# Patient Record
Sex: Male | Born: 1955
Health system: Southern US, Community
[De-identification: ages and names within clinical notes are randomized; demographics above are authoritative.]

## PROBLEM LIST (undated history)

## (undated) DIAGNOSIS — I2510 Atherosclerotic heart disease of native coronary artery without angina pectoris: Secondary | ICD-10-CM

## (undated) DIAGNOSIS — E785 Hyperlipidemia, unspecified: Secondary | ICD-10-CM

## (undated) DIAGNOSIS — G4730 Sleep apnea, unspecified: Secondary | ICD-10-CM

## (undated) DIAGNOSIS — K635 Polyp of colon: Secondary | ICD-10-CM

## (undated) DIAGNOSIS — R9430 Abnormal result of cardiovascular function study, unspecified: Secondary | ICD-10-CM

## (undated) DIAGNOSIS — E039 Hypothyroidism, unspecified: Secondary | ICD-10-CM

## (undated) DIAGNOSIS — N400 Enlarged prostate without lower urinary tract symptoms: Secondary | ICD-10-CM

## (undated) HISTORY — DX: Sleep apnea, unspecified: G47.30

## (undated) HISTORY — DX: Atherosclerotic heart disease of native coronary artery without angina pectoris: I25.10

## (undated) HISTORY — PX: NASAL SEPTUM SURGERY: SHX37

## (undated) HISTORY — DX: Polyp of colon: K63.5

## (undated) HISTORY — DX: Hypothyroidism, unspecified: E03.9

## (undated) HISTORY — DX: Abnormal result of cardiovascular function study, unspecified: R94.30

## (undated) HISTORY — DX: Hyperlipidemia, unspecified: E78.5

## (undated) HISTORY — DX: Benign prostatic hyperplasia without lower urinary tract symptoms: N40.0

---
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
## 2001-09-10 HISTORY — PX: CARDIAC CATHETERIZATION: SHX172

## 2001-09-18 ENCOUNTER — Inpatient Hospital Stay (HOSPITAL_COMMUNITY): Admission: EM | Admit: 2001-09-18 | Discharge: 2001-09-19 | Payer: Self-pay

## 2001-09-19 ENCOUNTER — Encounter: Payer: Self-pay | Admitting: *Deleted

## 2005-09-18 ENCOUNTER — Ambulatory Visit: Payer: Self-pay | Admitting: Cardiology

## 2005-12-25 ENCOUNTER — Encounter: Payer: Self-pay | Admitting: Cardiology

## 2007-06-09 ENCOUNTER — Encounter: Payer: Self-pay | Admitting: Cardiology

## 2007-06-13 ENCOUNTER — Encounter: Payer: Self-pay | Admitting: Physician Assistant

## 2007-06-13 ENCOUNTER — Ambulatory Visit: Payer: Self-pay | Admitting: Cardiology

## 2008-01-09 LAB — HM COLONOSCOPY

## 2010-03-27 ENCOUNTER — Encounter: Payer: Self-pay | Admitting: Cardiology

## 2010-03-27 DIAGNOSIS — E119 Type 2 diabetes mellitus without complications: Secondary | ICD-10-CM | POA: Insufficient documentation

## 2010-03-27 DIAGNOSIS — E785 Hyperlipidemia, unspecified: Secondary | ICD-10-CM | POA: Insufficient documentation

## 2010-03-27 HISTORY — DX: Hyperlipidemia, unspecified: E78.5

## 2010-03-28 ENCOUNTER — Ambulatory Visit: Payer: Self-pay | Admitting: Cardiology

## 2010-05-11 LAB — HM DIABETES EYE EXAM

## 2010-10-12 NOTE — Assessment & Plan Note (Signed)
Summary: EST- HAS NOT BEEN SEEN SINCE 2008   Visit Type:  Follow-up Primary Provider:  Dr. Rudi Heap  CC:  CAD.  History of Present Illness: The patient is seen in followup coronary artery disease. I saw him last in October, 2008. We know from catheterization in 2003 that the patient had nonobstructive coronary disease. He is not having any symptoms. 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 He followed carefully with Dr.Moore  concerning all the medications for his problems.  Preventive Screening-Counseling & Management  Alcohol-Tobacco     Smoking Status: quit     Year Quit: 15 yr ago  Current Medications (verified): 1)  Humulin N 100 Unit/ml Susp (Insulin Isophane Human) .... 50 Units Every Morning and 40 Units Every Evening 2)  Humulin R 100 Unit/ml Soln (Insulin Regular Human) .Marland Kitchen.. 10 Uints Every Morning and 10 Units Every Evening 3)  Lovaza 1 Gm Caps (Omega-3-Acid Ethyl Esters) .... Take 1 Tablet By Mouth Once A Day 4)  Aspir-Low 81 Mg Tbec (Aspirin) .... Take 1 Tablet By Mouth Once A Day 5)  Levothroid 175 Mcg Tabs (Levothyroxine Sodium) .... Take 1 Tablet By Mouth Once A Day 6)  Vitamin D 1000 Unit Tabs (Cholecalciferol) .... 2 Tabs Every Morning 7)  Ramipril 5 Mg Caps (Ramipril) .... Take 1 Tablet By Mouth Once A Day 8)  Lipitor 80 Mg Tabs (Atorvastatin Calcium) .... Take 1/2 Tab (40mg ) At Bedtime 9)  Niaspan 500 Mg Cr-Tabs (Niacin (Antihyperlipidemic)) .... 3 Tabs At Bedtime  Allergies: 1)  ! * Shrimp  Past History:  Past Medical History: CAD   nonobstructive...cath...2003 Ejection fraction 55%... catheterization... 2003 Hypertension Dyslipidemia DM Hypothyroidism Shrimp allergy  Social History: Smoking Status:  quit  Review of Systems       Patient denies fever, chills, headache, sweats, rash, change in vision, change in hearing, chest pain, cough, nausea vomiting, urinary symptoms. All other systems are reviewed and are  negative.  Vital Signs:  Patient profile:   55 year old male Height:      71 inches Weight:      233 pounds BMI:     32.61 Pulse rate:   64 / minute BP sitting:   122 / 75  (left arm) Cuff size:   regular  Vitals Entered By: Hoover Brunette, LPN (March 28, 2010 10:33 AM)  Nutrition Counseling: Patient's BMI is greater than 25 and therefore counseled on weight management options. CC: CAD Is Patient Diabetic? Yes Comments routine check - no problems   Physical Exam  General:  patient is stable. Head:  head is atraumatic. Eyes:  no xanthelasma. Neck:  no jugular venous distention. Chest Wall:  no chest wall tenderness. Lungs:  lungs are clear. Respiratory effort is nonlabored. Heart:  cardiac exam reveals an S1-S2. No clicks or significant murmurs. Abdomen:  abdomen is soft. Msk:  no musculoskeletal deformities. Extremities:  no peripheral edema. Skin:  no skin rashes. Psych:  patient is oriented to person time and place. Affect is normal.   Impression & Recommendations:  Problem # 1:  DYSLIPIDEMIA (ICD-272.4)  His updated medication list for this problem includes:    Lovaza 1 Gm Caps (Omega-3-acid ethyl esters) .Marland Kitchen... Take 1 tablet by mouth once a day    Lipitor 80 Mg Tabs (Atorvastatin calcium) .Marland Kitchen... Take 1/2 tab (40mg ) at bedtime    Niaspan 500 Mg Cr-tabs (Niacin (antihyperlipidemic)) .Marland KitchenMarland KitchenMarland KitchenMarland Kitchen 3 tabs at bedtime Patient's lipids are treated aggressively. No change in therapy.  Problem # 2:  HYPERTENSION (ICD-401.9) Blood pressure is under good control. No change in therapy.  Problem # 3:  CAD (ICD-414.00)  His updated medication list for this problem includes:    Aspir-low 81 Mg Tbec (Aspirin) .Marland Kitchen... Take 1 tablet by mouth once a day    Ramipril 5 Mg Caps (Ramipril) .Marland Kitchen... Take 1 tablet by mouth once a day  Orders: EKG w/ Interpretation (93000) EKG is done today and reviewed by me. There no significant abnormalities. I reviewed the old records concerning the patient's  catheterization in the past. At this time I cannot find the specifics but he had only mild nonobstructive coronary disease in 2003. He is receiving excellent secondary prevention. He is very active and has no symptoms. I consider exercise testing but decided is not needed at this time. I'll see him back for followup in 2 years.  Patient Instructions: 1)  Your physician wants you to follow-up in: 2 years. You will receive a reminder letter in the mail one-two months in advance. If you don't receive a letter, please call our office to schedule the follow-up appointment.  2)  Your physician recommends that you continue on your current medications as directed. Please refer to the Current Medication list given to you today.

## 2010-10-12 NOTE — Miscellaneous (Signed)
  Clinical Lists Changes  Problems: Added new problem of CAD (ICD-414.00) Added new problem of HYPERTENSION (ICD-401.9) Added new problem of DYSLIPIDEMIA (ICD-272.4) Added new problem of DM (ICD-250.00) Added new problem of HYPOTHYROIDISM (ICD-244.9) Observations: Added new observation of PAST MED HX: CAD   nonobstructive...cath...2003 Hypertension Dyslipidemia DM Hypothyroidism Shrimp allergy (03/27/2010 9:34)       Past History:  Past Medical History: CAD   nonobstructive...cath...2003 Hypertension Dyslipidemia DM Hypothyroidism Shrimp allergy

## 2010-10-17 LAB — HEMOCCULT GUIAC POC 1CARD (OFFICE)

## 2010-10-24 LAB — HM DIABETES FOOT EXAM

## 2010-10-24 LAB — HEMOGLOBIN A1C: Hgb A1c MFr Bld: 7.7 % — AB (ref 4.0–6.0)

## 2011-01-03 ENCOUNTER — Encounter: Payer: Self-pay | Admitting: Family Medicine

## 2011-01-03 DIAGNOSIS — K635 Polyp of colon: Secondary | ICD-10-CM

## 2011-01-03 DIAGNOSIS — N400 Enlarged prostate without lower urinary tract symptoms: Secondary | ICD-10-CM | POA: Insufficient documentation

## 2011-01-23 NOTE — Assessment & Plan Note (Signed)
[facility]                          [facility] OFFICE NOTE   NAITIK, HERMANN                        MRN:          [id number]  DATE:06/13/2007                            DOB:          [birth year]    CARDIOLOGIST:  Luis Abed, MD, [facility].   PRIMARY CARE PHYSICIAN:  Ernestina Penna, M.D.   REASON FOR VISIT:  An 34-month followup.   HISTORY OF PRESENT ILLNESS:  Mr. Colson is a 55 year old male patient who  has a history of nonobstructive coronary disease by catheterization in  2003.  He returns to the office today for followup.  He is doing well  without any chest pain or shortness of breath.  He denies any exertional  chest heaviness or tightness.  He denies any orthopnea, PND, or pedal  edema.  He denies any palpitations or syncope.   CURRENT MEDICATIONS:  1. Humulin N.  2. Humulin R.  3. Altace 5 mg daily.  4. Levoxyl 175 mcg daily.  5. Aspirin 81 mg daily.  6. Niaspan 500 mg three tablets a day.  7. Lovastatin 40 mg q.h.s.   ALLERGIES:  SHRIMP.   PHYSICAL EXAMINATION:  GENERAL:  He is a well nourished, well developed  male in no distress.  VITAL SIGNS:  Blood pressure is 132/78, pulse 92, weight 229.8 pounds.  HEENT:  Normal.  NECK:  Without JVD at 45 degrees.  CARDIAC:  Normal S1 S2.  Regular rate and rhythm without murmurs.  LUNGS:  Clear to auscultation bilaterally without wheezes, rhonchi, or  rales.  Decreased breath sounds bilaterally.  ABDOMEN:  Soft, nontender with normoactive bowel sounds.  No  organomegaly.  EXTREMITIES:  With trace edema bilaterally.  Calves are soft, nontender.  SKIN:  Warm and dry.  NEUROLOGIC:  He is alert and oriented x3.  Cranial nerves II-XII grossly  intact.  VASCULAR:  No carotid bruits heard bilaterally.  Dorsalis pedis and  posterior tibialis pulses are 2 plus bilaterally.   Electrocardiogram reveals a sinus rhythm with a heart rate of 75,  leftward axis, incomplete right bundle branch  block, no acute changes.   IMPRESSION:  1. Nonobstructive coronary artery disease by catheterization 2003.  2. Hypertension.  3. Hyperlipidemia.  4. Diabetes mellitus.  5. Ex-smoker.  6. Hypothyroidism, treated.  7. Shrimp allergy.   PLAN:  The patient presents to the office today for followup.  Overall,  he is doing well without any symptoms of chest pain or shortness of  breath.  Continue risk factor modification will be pursued with his  primary care physician.  Dr. Myrtis Ser also saw the patient today.  We plan  to bring him back in one year's time of routine followup or sooner  p.r.n.      Tereso Newcomer, PA-C  Electronically Signed      Luis Abed, MD, [facility]  Electronically Signed   SW/MedQ  DD: 06/13/2007  DT: 06/13/2007  Job #: [id number]   cc:   Ernestina Penna, M.D.

## 2011-01-26 NOTE — Discharge Summary (Signed)
[place]. [facility]  Patient:    Joseph Kirby, Joseph Kirby Visit Number: [id number] MRN: [id number]          Service Type: MED Location: 6500 [id number] Attending Physician:  Rollene Rotunda Dictated by:   Rozell Searing, P.A. Admit Date:  09/18/2001 Discharge Date: 09/19/2001   CC:         Monica Becton, M.D.   Referring Physician Discharge [place]  PROCEDURES: 1. Coronary angiogram. 2. Chest CT scan.  REASON FOR ADMISSION:  Mr. Samples is a 55 year old male, with no prior history of heart disease, but multiple cardiac risk factors notable for insulin-requiring diabetes mellitus, dyslipidemia, and history of tobacco, who initially presented to his primary care physician for evaluation of chest pain.  He report relief with nitroglycerin, and arrangements were subsequently made to transfer patient to [facility] for further evaluation and management. Please refer to dictated admission note for full details.  LABORATORY DATA:  WBC 10.0, Hgb 55m, hct 38.7, platelets 206 at discharge. D-dimer less than 0.22.  Sodium 135, potassium 3.7, glucose 253, BUN 15, creatinine 0.9 at discharge.  Liver enzymes:  Elevated total bilirubin 1.5 and SGOT 46, otherwise normal.  Cardiac enzymes:  CPK-MB negative x 3; troponin I markers normal x 3.  Admission CXR:  NAD.  Chest CT:  Negative.  HOSPITAL COURSE:  The patient was admitted for management of unstable angina pectoris and was placed on intravenous nitroglycerin and heparin.  Low-dose Lopressor was added to the medication regimen.  Subsequent extensive testing consisted of all serial cardiac enzymes negative as well as a negative D-dimer.  Coronary angiogram, performed September 18, 2001, by Dr. Chales Abrahams (see cath report for full details), revealed nonobstructive coronary artery disease with normal LV function.  Specifically, there were 30% lesions noted in the mid LAD, mid AV/CFX, and mid RCA.  Additionally, ascending aortogram  revealed no evidence of dissection.  Patient also underwent subsequent CT scan of the chest, which was also negative.  Following completion of all these studies, the patient was cleared for discharge with instructions to follow up with his primary care physician, Dr. Vernon Prey.  We will place him back on his previous home medication regimen, with instructions to reduce aspirin to 81 mg q.d.  DISCHARGE MEDICATIONS: 1. Aspirin 81 mg q.d. 2. Levoxyl 0.175 mg q.d. 3. Zocor 40 mg q.d. 4. Altace 2.5 mg q.d. 5. Niaspan 1500 mg q.d. 6. Humulin N 30 units q.a.m., 40 units q.p.m. 7. Humulin R 6 units b.i.d.  INSTRUCTIONS:  No heavy lifting/driving x 2 days; Low-fat/cholesterol diet; Call the office if there is any swelling/bleeding in the groin.  The patient is instructed to call and schedule a follow-up appointment with Dr. Vernon Prey in the following 1-2 weeks.  DISCHARGE DIAGNOSES: 1. Nonischemic chest pain.    a. Normal serial cardiac enzymes.    b. Nonobstructive coronary artery disease/normal left ventricular function       - catheterization September 18, 2001.    c. Negative chest computed tomography scan. 2. Diabetes mellitus - insulin-requiring. 3. Dyslipidemia. 4. Remote tobacco. Dictated by:   Rozell Searing, P.A. Attending Physician:  Rollene Rotunda DD:  09/19/01 TD:  09/19/01 Job: [id number]

## 2011-01-26 NOTE — Cardiovascular Report (Signed)
[place]. [facility]  Patient:    CLOYD, RAGAS Visit Number: [id number] MRN: [id number]          Service Type: MED Location: 6500 [id number] Attending Physician:  Rollene Rotunda Dictated by:   Veneda Melter, M.D. [facility] Proc. Date: 09/18/01 Admit Date:  09/18/2001   CC:         Luis Abed, M.D. [facility]  Monica Becton, M.D.   Cardiac Catheterization  PROCEDURES PERFORMED: 1. Left heart catheterization. 2. Left ventriculogram. 3. Selective coronary angiography. 4. Aortic root angiogram.  DIAGNOSES: 1. Mild coronary artery disease by angiogram. 2. Normal left ventricular systolic function.  HISTORY:  Mr. Burchill is a 55 year old gentleman who presents with substernal chest discomfort.  The patient has had two episodes since December and this morning presented with recurrence of pain.  He was admitted to the hospital, stabilized medically, and presents now for further cardiac assessment.  DESCRIPTION OF PROCEDURE:  Informed consent was obtained.  The patient was brought to the catheterization lab.  A #6 French sheath was placed in the right femoral artery.  Selective coronary angiography was then performed using preformed #6 [place] Judkins catheters and manual injections of contrast.  An AL1 catheter was used to engage the left anterior descending artery which had a separate ostia for the left circumflex artery.  The left main was nonexistent.  Subsequently, a #6 French pigtail catheter was introduced.  Left heart catheterization, left ventriculogram, and aortic root angiogram were performed using power injections of contrast.  At the termination of the case, an attempt was made to place a Perclose suture closure device for the right femoral artery; however, adequate hemostasis could not be achieved and manual pressure was applied.  Hemostasis was then achieved, and the patient was transferred to the floor in stable condition.  Findings are as  follows:  FINDINGS: 1. Left main trunk:  Nonexistent.  The LAD and left circumflex artery have    separate ostia. 2. LAD:  Large caliber vessel that provides two diagonal branches. The mid    section of the LAD has mild irregularities of 30% in the mid section. 3. Left circumflex artery:  This is a large caliber vessel that provides two    marginal branches.  The LAD has mild disease of 30% in the mid AV    circumflex. 4. The right coronary artery is dominant.  This is a medium caliber vessel    that provides a posterior descending artery in its terminal segment.  The    right coronary artery has mild irregularities of 30% in the mid section.  LV:  Normal end systolic and end diastolic dimensions.  Overall left ventricular function is well preserved.  Ejection fraction of greater than 55%.  No mitral regurgitation.  LV pressure is 100/5.  Aortic is 100/55. LVEDP equals 16.  Aortic root is of normal caliber extending into the arch and thoracic aorta. The great vessels appear of normal caliber at their origin.  There is no evidence of dissection.  ASSESSMENT AND PLAN:  Mr. Mclees is a 55 year old gentleman with chest discomfort of unclear etiology.  Further assessment will be made and medical management of his coronary disease pursued. Dictated by:   Veneda Melter, M.D. [facility] Attending Physician:  Rollene Rotunda DD:  09/18/01 TD:  09/18/01 Job: [id number]

## 2011-01-26 NOTE — H&P (Signed)
[place]. [facility]  Patient:    Joseph Kirby, Joseph Kirby Visit Number: [id number] MRN: [id number]          Service Type: MED Location: 1800 [id number] [id number] Attending Physician:  Pearletha Alfred Dictated by:   Brita Romp, P.A. Admit Date:  09/18/2001   CC:         Monica Becton, M.D.  Rollene Rotunda, M.D. [facility]   History and Physical  CHIEF COMPLAINT:  Chest pain which begins in the left upper arm and radiates across the chest to the right shoulder.  HISTORY OF PRESENT ILLNESS:  Joseph Kirby is a 55 year old male with no history of coronary artery disease.  He reports episodes of chest pain as previously described.  The first episode was in November of 2002, the second was two weeks ago, and the third episode was this morning.  He describes the pain as a heavy ache in nature and is accompanied by nausea, diaphoresis, and shortness of breath.  He states that the pain started while he was preparing to go to work.  During the drive to work, it increased and he diverted to Monica Becton, M.D., office.  He received sublingual nitroglycerin and was transported to [facility] [facility] [employer] [facility] for further evaluation.  The patient was seen and admitted by Luis Abed, M.D.  Dr. Myrtis Ser noted that the initial EKG showed no acute changes and the initial cardiac enzymes were negative.  The patient was also pain-free at the time of examination. Given the patients cardiac risk factors, as well as his presenting symptoms, Dr. Myrtis Ser felt that a cardiac catheterization was indicated.  PAST MEDICAL HISTORY:  Significant for diabetes mellitus, hyperlipidemia, and hypothyroidism.  SOCIAL HISTORY:  [place] and is employed as an Personnel officer.  He reports approximately a 30-pack-year history of tobacco use, however, none in the past 10 years.  He denies any alcohol intake or illicit drug use.  FAMILY HISTORY:  There  is no early coronary artery disease in his parents or his siblings.  REVIEW OF SYSTEMS:  Constitutional:  There are no fevers, chills, sweats, weight change, or adenopathy.  HEENT:  There are no headaches, vertigo symptoms, or changes in vision or hearing.  Skin:  There is no rash or lesions.  Cardiopulmonary:  Significant for chest pain and shortness of breath.  He denies any dyspnea on exertion, orthopnea, paroxysmal nocturnal dyspnea, edema, palpitations, presyncopal symptoms, or claudication symptoms. Genitourinary:  The patient denies any frequency, urgency, dysuria, or hematuria.  Neuropsychiatric:  He denies any weakness, numbness, or mood disturbances.  Musculoskeletal:  He denies any myalgias, arthralgias, joint swelling, deformity, or pain.  Gastrointestinal:  Significant for nausea with pain.  The patient denies any vomiting, diarrhea, bright red blood per rectum, melena, hematemesis, or reflux-type symptoms.  Endocrine:  The patient denies any heat or cold intolerance, skin, or hair changes.  All other systems were negative.  PHYSICAL EXAMINATION:  Heart rate of 90, blood pressure in the left arm 121/48 and in the right arm 121/58, respiratory rate 12.  GENERAL APPEARANCE:  A 55 year old Caucasian male in no acute distress.  HEENT:  The patient is normocephalic and atraumatic.  Pupils equal, round, and reactive to light and accommodation.  Extraocular movements are intact.  The sclerae are clear.  NECK:  Supple without lymphadenopathy, thyromegaly, jugular venous distention, or carotid bruits.  LYMPHATICS:  No lymphadenopathy noted.  CARDIOVASCULAR:  The  heart has a regular rate and rhythm.  Normal S1 and S2. No S3 or S4.  It is without murmurs, rubs, or gallops.  Pulses are 2+ and equal bilaterally and without bruits.  LUNGS:  Clear to auscultation without rhonchi, rales, or wheezes.  SKIN:  No rashes or lesions.  ABDOMEN:  Soft and nontender.  There are normal  bowel sounds.  There is no rebound or guarding.  There is no hepatosplenomegaly.  EXTREMITIES:  No clubbing, cyanosis, or edema.  There are no obvious joint deformities or effusions.  NEUROLOGIC:  Nonfocal.  The patient is awake, alert, and oriented x 3. Cranial nerves II-XII are grossly intact.  LABORATORY DATA:  The chest x-ray shows no acute distress and there is no widened mediastinum.  The electrocardiogram shows sinus rhythm at 69, axis 31, PR interval 154, QRS 100, and QTC 447.  There are no ischemic changes.  White count 12.6, hemoglobin 15.8, hematocrit 44.2, platelets 205. Sodium 137, potassium 4.4, chloride 101, CO2 25, BUN 18, creatinine 1.0, glucose 249, total bilirubin 1.5, AST 46, ALT 36, alkaline phosphatase 85, total protein 6.3, albumin 3.8.  Total CK 115, MB fraction 2.6, troponin I less than 0.01.  PTT 27, PT 15.5, INR 1.2.  ASSESSMENT AND PLAN. 1. Chest pain.  Given the patients risk factors and symptomatology, quite    suspicious for unstable angina.  We will check cardiac enzymes and start    the patient on aspirin, intravenous nitroglycerin, as well as intravenous    heparin, and a beta blocker.  Will plan to take the patient to the    catheterization laboratory later in the day of admission.  We will also    check a D-dimer to rule out any evidence of pulmonary embolism.  No    IIb-IIIa agents will be employed at this time.  The aortic root will be    visualized during catheterization to further rule out any aortic    dissection. 2. Diabetes mellitus.  Will continue his insulin with sliding scale coverage    as needed. 3. Hyperlipidemia.  Will continue his Zocor and Niaspan and check a fasting    lipid panel the morning after admission. 4. Hypothyroidism.  We will check a TSH level. Dictated by:   Brita Romp, P.A. Attending Physician:  [place] B DD:  09/18/01 TD:  09/18/01 Job: [id number]

## 2011-08-09 ENCOUNTER — Encounter: Payer: Self-pay | Admitting: Cardiology

## 2011-11-09 ENCOUNTER — Encounter: Payer: Self-pay | Admitting: *Deleted

## 2012-03-17 ENCOUNTER — Encounter (HOSPITAL_COMMUNITY): Payer: Self-pay | Admitting: Emergency Medicine

## 2012-03-17 ENCOUNTER — Inpatient Hospital Stay (HOSPITAL_COMMUNITY)
Admission: EM | Admit: 2012-03-17 | Discharge: 2012-03-21 | DRG: 294 | Disposition: A | Discharge status: Home / Self Care | Payer: BC Managed Care – PPO | Attending: Internal Medicine | Admitting: Internal Medicine

## 2012-03-17 DIAGNOSIS — E1310 Other specified diabetes mellitus with ketoacidosis without coma: Principal | ICD-10-CM | POA: Diagnosis present

## 2012-03-17 DIAGNOSIS — E860 Dehydration: Secondary | ICD-10-CM

## 2012-03-17 DIAGNOSIS — N400 Enlarged prostate without lower urinary tract symptoms: Secondary | ICD-10-CM | POA: Diagnosis present

## 2012-03-17 DIAGNOSIS — E119 Type 2 diabetes mellitus without complications: Secondary | ICD-10-CM

## 2012-03-17 DIAGNOSIS — I10 Essential (primary) hypertension: Secondary | ICD-10-CM

## 2012-03-17 DIAGNOSIS — E039 Hypothyroidism, unspecified: Secondary | ICD-10-CM

## 2012-03-17 DIAGNOSIS — K635 Polyp of colon: Secondary | ICD-10-CM

## 2012-03-17 DIAGNOSIS — E1110 Type 2 diabetes mellitus with ketoacidosis without coma: Secondary | ICD-10-CM | POA: Diagnosis present

## 2012-03-17 DIAGNOSIS — E785 Hyperlipidemia, unspecified: Secondary | ICD-10-CM | POA: Diagnosis present

## 2012-03-17 DIAGNOSIS — I76 Septic arterial embolism: Secondary | ICD-10-CM | POA: Diagnosis present

## 2012-03-17 DIAGNOSIS — I2510 Atherosclerotic heart disease of native coronary artery without angina pectoris: Secondary | ICD-10-CM | POA: Diagnosis present

## 2012-03-17 LAB — BASIC METABOLIC PANEL
BUN: 27 mg/dL — ABNORMAL HIGH (ref 6–23)
BUN: 29 mg/dL — ABNORMAL HIGH (ref 6–23)
BUN: 29 mg/dL — ABNORMAL HIGH (ref 6–23)
BUN: 23 mg/dL (ref 6–23)
CO2: 11 mEq/L — ABNORMAL LOW (ref 19–32)
CO2: 9 mEq/L — CL (ref 19–32)
CO2: 11 mEq/L — ABNORMAL LOW (ref 19–32)
CO2: 11 mEq/L — ABNORMAL LOW (ref 19–32)
Calcium: 9.2 mg/dL (ref 8.4–10.5)
Calcium: 8.6 mg/dL (ref 8.4–10.5)
Calcium: 8.3 mg/dL — ABNORMAL LOW (ref 8.4–10.5)
Calcium: 8.2 mg/dL — ABNORMAL LOW (ref 8.4–10.5)
Chloride: 90 mEq/L — ABNORMAL LOW (ref 96–112)
Chloride: 94 mEq/L — ABNORMAL LOW (ref 96–112)
Chloride: 100 mEq/L (ref 96–112)
Creatinine, Ser: 1.2 mg/dL (ref 0.50–1.35)
Creatinine, Ser: 1.27 mg/dL (ref 0.50–1.35)
Creatinine, Ser: 1.25 mg/dL (ref 0.50–1.35)
Creatinine, Ser: 1.19 mg/dL (ref 0.50–1.35)
GFR calc Af Amer: 77 mL/min — ABNORMAL LOW (ref >90)
GFR calc Af Amer: 72 mL/min — ABNORMAL LOW (ref >90)
GFR calc Af Amer: 78 mL/min — ABNORMAL LOW (ref >90)
GFR calc non Af Amer: 66 mL/min — ABNORMAL LOW (ref >90)
GFR calc non Af Amer: 67 mL/min — ABNORMAL LOW (ref >90)
GFR calc non Af Amer: 63 mL/min — ABNORMAL LOW (ref >90)
GFR calc non Af Amer: 67 mL/min — ABNORMAL LOW (ref >90)
Glucose, Bld: 537 mg/dL — ABNORMAL HIGH (ref 70–99)
Glucose, Bld: 533 mg/dL — ABNORMAL HIGH (ref 70–99)
Glucose, Bld: 357 mg/dL — ABNORMAL HIGH (ref 70–99)
Glucose, Bld: 236 mg/dL — ABNORMAL HIGH (ref 70–99)
Potassium: 5.6 mEq/L — ABNORMAL HIGH (ref 3.5–5.1)
Potassium: 5.8 mEq/L — ABNORMAL HIGH (ref 3.5–5.1)
Sodium: 130 mEq/L — ABNORMAL LOW (ref 135–145)
Sodium: 130 mEq/L — ABNORMAL LOW (ref 135–145)

## 2012-03-17 LAB — URINALYSIS, ROUTINE W REFLEX MICROSCOPIC
Bilirubin Urine: NEGATIVE
Glucose, UA: >1000 mg/dL — AB
Hgb urine dipstick: NEGATIVE
Ketones, ur: >80 mg/dL — AB
Leukocytes, UA: NEGATIVE
Nitrite: NEGATIVE
Protein, ur: NEGATIVE mg/dL
Specific Gravity, Urine: 1.029 (ref 1.005–1.030)
Urobilinogen, UA: 0.2 mg/dL (ref 0.0–1.0)
pH: 5 (ref 5.0–8.0)

## 2012-03-17 LAB — URINE MICROSCOPIC-ADD ON

## 2012-03-17 LAB — GLUCOSE, CAPILLARY
Glucose-Capillary: 492 mg/dL — ABNORMAL HIGH (ref 70–99)
Glucose-Capillary: 488 mg/dL — ABNORMAL HIGH (ref 70–99)
Glucose-Capillary: 467 mg/dL — ABNORMAL HIGH (ref 70–99)
Glucose-Capillary: 445 mg/dL — ABNORMAL HIGH (ref 70–99)
Glucose-Capillary: 411 mg/dL — ABNORMAL HIGH (ref 70–99)
Glucose-Capillary: 206 mg/dL — ABNORMAL HIGH (ref 70–99)
Glucose-Capillary: 191 mg/dL — ABNORMAL HIGH (ref 70–99)

## 2012-03-17 LAB — CBC
HCT: 46 % (ref 39.0–52.0)
Hemoglobin: 16 g/dL (ref 13.0–17.0)
MCH: 29.2 pg (ref 26.0–34.0)
MCHC: 34.8 g/dL (ref 30.0–36.0)
MCV: 83.9 fL (ref 78.0–100.0)
Platelets: 332 10*3/uL (ref 150–400)
RBC: 5.48 MIL/uL (ref 4.22–5.81)
RDW: 13 % (ref 11.5–15.5)
WBC: 17.6 10*3/uL — ABNORMAL HIGH (ref 4.0–10.5)

## 2012-03-17 LAB — BLOOD GAS, ARTERIAL
Acid-base deficit: 17.6 mmol/L — ABNORMAL HIGH (ref 0.0–2.0)
Bicarbonate: 8.7 mEq/L — ABNORMAL LOW (ref 20.0–24.0)
Drawn by: 103701
FIO2: 0.21 %
O2 Saturation: 97 %
Patient temperature: 98.6
TCO2: 7.9 mmol/L (ref 0–100)
pCO2 arterial: 21.8 mmHg — ABNORMAL LOW (ref 35.0–45.0)
pH, Arterial: 7.224 — ABNORMAL LOW (ref 7.350–7.450)
pO2, Arterial: 107 mmHg — ABNORMAL HIGH (ref 80.0–100.0)

## 2012-03-17 LAB — PHOSPHORUS: Phosphorus: 4.2 mg/dL (ref 2.3–4.6)

## 2012-03-17 LAB — TROPONIN I: Troponin I: <0.3 ng/mL (ref <0.30)

## 2012-03-17 MED ORDER — POTASSIUM CHLORIDE 10 MEQ/100ML IV SOLN
INTRAVENOUS | Status: AC
  Filled 2012-03-17: qty 100

## 2012-03-17 MED ORDER — ONDANSETRON HCL 4 MG/2ML IJ SOLN: 4.0000 mg | Freq: Four times a day (QID) | INTRAMUSCULAR | Status: DC | PRN

## 2012-03-17 MED ORDER — DEXTROSE-NACL 5-0.45 % IV SOLN
INTRAVENOUS | Status: DC
  Administered 2012-03-17: 75 mL/h via INTRAVENOUS
  Administered 2012-03-18: 08:00:00 via INTRAVENOUS

## 2012-03-17 MED ORDER — SODIUM CHLORIDE 0.9 % IV SOLN: INTRAVENOUS | Status: DC

## 2012-03-17 MED ORDER — ENOXAPARIN SODIUM 40 MG/0.4ML ~~LOC~~ SOLN
40.0000 mg | SUBCUTANEOUS | Status: DC
  Filled 2012-03-17: qty 0.4

## 2012-03-17 MED ORDER — DEXTROSE 50 % IV SOLN
25.0000 mL | INTRAVENOUS | Status: DC | PRN
  Filled 2012-03-17: qty 50

## 2012-03-17 MED ORDER — DEXTROSE-NACL 5-0.45 % IV SOLN: INTRAVENOUS | Status: DC

## 2012-03-17 MED ORDER — SODIUM CHLORIDE 0.9 % IJ SOLN
3.0000 mL | Freq: Two times a day (BID) | INTRAMUSCULAR | Status: DC
  Administered 2012-03-17 – 2012-03-19 (×2): 3 mL via INTRAVENOUS

## 2012-03-17 MED ORDER — RAMIPRIL 5 MG PO CAPS
5.0000 mg | ORAL_CAPSULE | Freq: Every day | ORAL | Status: DC
  Administered 2012-03-18 – 2012-03-21 (×4): 5 mg via ORAL
  Filled 2012-03-17 (×5): qty 1

## 2012-03-17 MED ORDER — SODIUM CHLORIDE 0.9 % IV BOLUS (SEPSIS)
2000.0000 mL | Freq: Once | INTRAVENOUS | Status: AC
  Administered 2012-03-17: 2000 mL via INTRAVENOUS

## 2012-03-17 MED ORDER — BISMUTH SUBSALICYLATE 262 MG/15ML PO SUSP
15.0000 mL | Freq: Four times a day (QID) | ORAL | Status: DC | PRN
  Administered 2012-03-20: 15 mL via ORAL
  Filled 2012-03-17 (×2): qty 236

## 2012-03-17 MED ORDER — POTASSIUM CHLORIDE 10 MEQ/100ML IV SOLN: 10.0000 meq | INTRAVENOUS | Status: AC

## 2012-03-17 MED ORDER — LEVOTHYROXINE SODIUM 175 MCG PO TABS
175.0000 ug | ORAL_TABLET | Freq: Every day | ORAL | Status: DC
  Administered 2012-03-18 – 2012-03-21 (×4): 175 ug via ORAL
  Filled 2012-03-17 (×5): qty 1

## 2012-03-17 MED ORDER — ENOXAPARIN SODIUM 40 MG/0.4ML ~~LOC~~ SOLN
40.0000 mg | SUBCUTANEOUS | Status: DC
  Administered 2012-03-17 – 2012-03-20 (×4): 40 mg via SUBCUTANEOUS
  Filled 2012-03-17 (×5): qty 0.4

## 2012-03-17 MED ORDER — VITAMIN D3 25 MCG (1000 UNIT) PO TABS
3000.0000 [IU] | ORAL_TABLET | Freq: Every day | ORAL | Status: DC
  Administered 2012-03-18 – 2012-03-21 (×4): 3000 [IU] via ORAL
  Filled 2012-03-17 (×5): qty 3

## 2012-03-17 MED ORDER — ASPIRIN 81 MG PO CHEW
81.0000 mg | CHEWABLE_TABLET | Freq: Every day | ORAL | Status: DC
  Administered 2012-03-18 – 2012-03-21 (×4): 81 mg via ORAL
  Filled 2012-03-17 (×5): qty 1

## 2012-03-17 MED ORDER — SODIUM CHLORIDE 0.9 % IV SOLN
INTRAVENOUS | Status: DC
  Administered 2012-03-17: 8.8 [IU]/h via INTRAVENOUS
  Filled 2012-03-17: qty 1

## 2012-03-17 MED ORDER — SODIUM CHLORIDE 0.9 % IV SOLN
INTRAVENOUS | Status: DC
  Administered 2012-03-18 – 2012-03-19 (×3): via INTRAVENOUS
  Administered 2012-03-20: 100 mL/h via INTRAVENOUS
  Administered 2012-03-20 – 2012-03-21 (×2): via INTRAVENOUS

## 2012-03-17 MED ORDER — ONDANSETRON HCL 4 MG/2ML IJ SOLN: 4.0000 mg | Freq: Three times a day (TID) | INTRAMUSCULAR | Status: AC | PRN

## 2012-03-17 MED ORDER — ATORVASTATIN CALCIUM 80 MG PO TABS
80.0000 mg | ORAL_TABLET | Freq: Every day | ORAL | Status: DC
  Administered 2012-03-18 – 2012-03-21 (×4): 80 mg via ORAL
  Filled 2012-03-17 (×5): qty 1

## 2012-03-17 MED ORDER — OMEGA-3-ACID ETHYL ESTERS 1 G PO CAPS
1.0000 g | ORAL_CAPSULE | Freq: Every day | ORAL | Status: DC
  Administered 2012-03-18 – 2012-03-21 (×4): 1 g via ORAL
  Filled 2012-03-17 (×5): qty 1

## 2012-03-17 MED ORDER — INSULIN REGULAR HUMAN 100 UNIT/ML IJ SOLN
INTRAMUSCULAR | Status: DC
  Administered 2012-03-17: 8.8 [IU]/h via INTRAVENOUS
  Administered 2012-03-17: 11.6 [IU]/h via INTRAVENOUS
  Administered 2012-03-17: 4.3 [IU]/h via INTRAVENOUS
  Filled 2012-03-17: qty 1

## 2012-03-17 MED ORDER — ONDANSETRON HCL 4 MG PO TABS
4.0000 mg | ORAL_TABLET | Freq: Four times a day (QID) | ORAL | Status: DC | PRN
  Administered 2012-03-19 – 2012-03-20 (×2): 4 mg via ORAL
  Filled 2012-03-17 (×2): qty 1

## 2012-03-17 MED ORDER — SODIUM CHLORIDE 0.9 % IV SOLN
1000.0000 mL | INTRAVENOUS | Status: DC
  Administered 2012-03-17: 1000 mL via INTRAVENOUS

## 2012-03-17 MED ORDER — NIACIN ER (ANTIHYPERLIPIDEMIC) 500 MG PO TBCR
1500.0000 mg | EXTENDED_RELEASE_TABLET | Freq: Every day | ORAL | Status: DC
  Administered 2012-03-17 – 2012-03-20 (×4): 1500 mg via ORAL
  Filled 2012-03-17 (×6): qty 3

## 2012-03-17 MED ORDER — ONDANSETRON HCL 4 MG/2ML IJ SOLN
4.0000 mg | Freq: Once | INTRAMUSCULAR | Status: AC
  Administered 2012-03-17: 4 mg via INTRAVENOUS
  Filled 2012-03-17: qty 2

## 2012-03-17 MED ORDER — SODIUM CHLORIDE 0.9 % IV SOLN: INTRAVENOUS | Status: AC

## 2012-03-17 NOTE — ED Provider Notes (Signed)
History    56M with general malaise and fatigue. Gradual onset Thursday. Constant and progressive. No energy. Blood sugars running higher than normally do. Recent change in insulin regiment about 3w ago. Pt unclear as to why was changed. Reports compliance. No fever or chills. Nausea and has had some vomiting. NBNB. Polyuria, but no other urinary complaints. Feels thirsty but drinking less because makes nausea worse. No CP or SOB. No rash.   CSN: [id number]  Arrival date & time 03/17/12  1013   First MD Initiated Contact with Patient 03/17/12 1101      Chief Complaint  Patient presents with  . Hyperglycemia    (Consider location/radiation/quality/duration/timing/severity/associated sxs/prior treatment) HPI  Past Medical History  Diagnosis Date  . BPH (benign prostatic hyperplasia)   . Colon polyp   . Coronary artery disease   . Hypertension   . Hyperlipidemia   . Diabetes mellitus   . Hypothyroidism     Past Surgical History  Procedure Date  . Cardiac catheterization 2003    NONOBSTRUCTIVE, EF 55%    No family history on file.  History  Substance Use Topics  . Smoking status: Current Everyday Smoker  . Smokeless tobacco: Not on file  . Alcohol Use: No      Review of Systems   Review of symptoms negative unless otherwise noted in HPI.   Allergies  Ezetimibe-simvastatin and Shrimp  Home Medications   Current Outpatient Rx  Name Route Sig Dispense Refill  . ASPIRIN 81 MG PO TABS Oral Take 81 mg by mouth daily.      . ATORVASTATIN CALCIUM 80 MG PO TABS Oral Take 80 mg by mouth daily. Every other day    . BISMUTH SUBSALICYLATE 262 MG/15ML PO SUSP Oral Take 15 mLs by mouth every 6 (six) hours as needed. For upset stomach    . VITAMIN D 1000 UNITS PO TABS Oral Take 3,000 Units by mouth daily.     . INSULIN LISPRO PROT & LISPRO (75-25) 100 UNIT/ML [place] SUSP Subcutaneous Inject 40-50 Units into the skin 2 (two) times daily. Inject 50 units in the morning and 40  units in the evening.    Marland Kitchen LEVOTHYROXINE SODIUM 175 MCG PO TABS Oral Take 175 mcg by mouth daily.      Marland Kitchen NAPROXEN SODIUM 220 MG PO TABS Oral Take 220 mg by mouth 2 (two) times daily with a meal.    . NIACIN ER (ANTIHYPERLIPIDEMIC) 500 MG PO TBCR Oral Take 1,500 mg by mouth at bedtime. Take 3 tabs at night    . OMEGA-3-ACID ETHYL ESTERS 1 G PO CAPS Oral Take 1 g by mouth daily.      Marland Kitchen RAMIPRIL 5 MG PO CAPS Oral Take 5 mg by mouth daily.        BP 139/62  Pulse 100  Temp 97.7 F (36.5 C) (Oral)  Resp 20  Ht 5\' 11"  (1.803 m)  Wt 225 lb (102.059 kg)  BMI 31.38 kg/m2  SpO2 100%  Physical Exam  Nursing note and vitals reviewed. Constitutional: He is oriented to person, place, and time. He appears well-developed and well-nourished.       Laying in bed. Tired appearing.  HENT:  Head: Normocephalic and atraumatic.       Dry mucus membranes  Eyes: Conjunctivae are normal. Pupils are equal, round, and reactive to light. Right eye exhibits no discharge. Left eye exhibits no discharge.  Neck: Normal range of motion. Neck supple.  Cardiovascular: Regular rhythm and normal  heart sounds.  Exam reveals no gallop and no friction rub.   No murmur heard.      tachycardic  Pulmonary/Chest: Breath sounds normal.       [place] respirations  Abdominal: Soft. He exhibits no distension. There is no tenderness.  Musculoskeletal: He exhibits no edema and no tenderness.  Neurological: He is alert and oriented to person, place, and time. No cranial nerve deficit. Coordination normal.  Skin: Skin is warm and dry.  Psychiatric: He has a normal mood and affect. His behavior is normal. Thought content normal.    ED Course  Procedures (including critical care time)  CRITICAL CARE Performed by: Raeford Razor   Total critical care time: 40 minutes   Critical care time was exclusive of separately billable procedures and treating other patients.  Critical care was necessary to treat or prevent imminent  or life-threatening deterioration.  Critical care was time spent personally by me on the following activities: development of treatment plan with patient and/or surrogate as well as nursing, discussions with consultants, evaluation of patient's response to treatment, examination of patient, obtaining history from patient or surrogate, ordering and performing treatments and interventions, ordering and review of laboratory studies, ordering and review of radiographic studies, pulse oximetry and re-evaluation of patient's condition.  Labs Reviewed  BASIC METABOLIC PANEL - Abnormal; Notable for the following:    Sodium 130 (*)  REPEATED TO VERIFY   Potassium 5.6 (*)  NO VISIBLE HEMOLYSIS   Chloride 90 (*)  REPEATED TO VERIFY   CO2 11 (*)  REPEATED TO VERIFY   Glucose, Bld 537 (*)     BUN 27 (*)     GFR calc non Af Amer 66 (*)     GFR calc Af Amer 77 (*)     All other components within normal limits  CBC - Abnormal; Notable for the following:    WBC 17.6 (*)     All other components within normal limits  URINALYSIS, ROUTINE W REFLEX MICROSCOPIC - Abnormal; Notable for the following:    APPearance CLOUDY (*)     Glucose, UA >1000 (*)     Ketones, ur >80 (*)     All other components within normal limits  URINE MICROSCOPIC-ADD ON - Abnormal; Notable for the following:    Bacteria, UA FEW (*)     All other components within normal limits  TROPONIN I  BLOOD GAS, ARTERIAL   No results found.  EKG:  Rhythm: sinus tach Rate: 101 Axis: Left Intervals: bifasicular block ST segments: NS ST changes.  1. DKA (diabetic ketoacidoses)   2. Dehydration       MDM  56 year old male with general malaise and fatigue. Workup consistent with diabetic ketoacidosis. Suspect that this may be related to recent change in patient's insulin regimen. Consider infection as precipitating event, but no obvious source at this time if so. Plan IV fluids and insulin drip. Mild hyperkalemia noted, but expect to  resolve with tx.         Raeford Razor, MD 03/17/12 1234

## 2012-03-17 NOTE — ED Notes (Signed)
Per EMS, pt was at PCP's office [facility]-pt did not take insulin this am-slight nausea, weakness

## 2012-03-17 NOTE — H&P (Addendum)
PCP:  Rudi Heap, MD   DOA:  03/17/2012 10:13 AM  Chief Complaint:  generalized weakness, malaise  , nausea and vomiting X 4 days  HPI: 56 y/o male with hx of type 2 DM on insulin ( last A1C on 7.1 about 2 months back), HL, HTN, hypothyroidism , nonobstructive CAD with normall cath in 03( Follows with Dr Myrtis Ser), presented to ED with generalized weakness , malaise with nausea since past  4 days. He informs having poor appetite, feeling sweaty  and increased thirst and frequency on urination. He also had 2 episodes of vomiting since yesterday. Since past 4 days he has been noticing his fsg in 300s while it normally runs between 150-200. He recently had his insulin regimen switched by his PCP. He was previously on humulin N-R 50 /10 units am and 40/ 10 units pm and was changed to novolog of 15 units on am and 40 units pm.  He denies any fever but was sweaty, denies SOB, lethargy, chest pain , headache, palpitations, blurry vision, abdominal pain or  Diarrhea. Denies any URI symptoms , sick contacts or recent travel. He informs being compliant to his insulin ans has not missed any  dose.   In the ED he was found to be tachycardic in 110s with labs showing blood glucose > 500, significant anion gap of 29, hyponatremia, hyperkalemia, leucocytosis and bicarb of 11 with positive urine ketones suggestive of severe DKA. ABG showed a PH of 7.2 with bicarb of 7. Patient started on aggressive IV NS  And insulin drip. [facility] hospitalist called to admit patient to stepdown.  on my evaluation,  patient still feels very weak and lethargic but has not vomited since this am.   Allergies: Allergies  Allergen Reactions  . Ezetimibe-Simvastatin   . Iodine Swelling  . Shrimp (Shellfish Allergy)     Prior to Admission medications   Medication Sig Start Date End Date Taking? Authorizing Provider  aspirin 81 MG tablet Take 81 mg by mouth daily.     Yes Historical Provider, MD  atorvastatin (LIPITOR) 80 MG tablet  Take 80 mg by mouth daily. Every other day   Yes Historical Provider, MD  bismuth subsalicylate (PEPTO BISMOL) 262 MG/15ML suspension Take 15 mLs by mouth every 6 (six) hours as needed. For upset stomach   Yes Historical Provider, MD  cholecalciferol (VITAMIN D) 1000 UNITS tablet Take 3,000 Units by mouth daily.    Yes Historical Provider, MD  insulin lispro protamine-insulin lispro (HUMALOG MIX 75/25) (75-25) 100 UNIT/ML SUSP Inject 40-50 Units into the skin 2 (two) times daily. Inject 50 units in the morning and 40 units in the evening.   Yes Historical Provider, MD  levothyroxine (SYNTHROID, LEVOTHROID) 175 MCG tablet Take 175 mcg by mouth daily.     Yes Historical Provider, MD  naproxen sodium (ANAPROX) 220 MG tablet Take 220 mg by mouth 2 (two) times daily with a meal.   Yes Historical Provider, MD  niacin (NIASPAN) 500 MG CR tablet Take 1,500 mg by mouth at bedtime. Take 3 tabs at night   Yes Historical Provider, MD  omega-3 acid ethyl esters (LOVAZA) 1 G capsule Take 1 g by mouth daily.     Yes Historical Provider, MD  ramipril (ALTACE) 5 MG capsule Take 5 mg by mouth daily.     Yes Historical Provider, MD    Past Medical History  Diagnosis Date  . BPH (benign prostatic hyperplasia)   . Colon polyp   . Coronary  artery disease   . Hypertension   . Hyperlipidemia   . Diabetes mellitus   . Hypothyroidism     Past Surgical History  Procedure Date  . Cardiac catheterization 2003    NONOBSTRUCTIVE, EF 55%    Social History:  reports that he quit smoking about 19 years ago. His smoking use included Cigarettes. He has never used smokeless tobacco. He reports that he does not drink alcohol or use illicit drugs.  Family history:  History reviewed. Sister has DM  Review of Systems:  Constitutional: Denies fever, chills, diaphoresis, appetite change and fatigue.  HEENT: Denies photophobia, eye pain, redness, hearing loss, ear pain, congestion, sore throat, rhinorrhea, sneezing, mouth  sores, trouble swallowing, neck pain, neck stiffness and tinnitus.   Respiratory: Denies SOB, DOE, cough, chest tightness,  and wheezing.   Cardiovascular: Denies chest pain, palpitations and leg swelling.  Gastrointestinal: Denies nausea, vomiting, abdominal pain, diarrhea, constipation, blood in stool and abdominal distention.  Genitourinary: Denies dysuria, urgency, frequency, hematuria, flank pain and difficulty urinating.  Musculoskeletal: Denies myalgias, back pain, joint swelling, arthralgias and gait problem.  Skin: Denies pallor, rash and wound.  Neurological: Denies dizziness, seizures, syncope, weakness, light-headedness, numbness and headaches.  Hematological: Denies adenopathy. Easy bruising, personal or family bleeding history  Psychiatric/Behavioral: Denies suicidal ideation, mood changes, confusion, nervousness, sleep disturbance and agitation   Physical Exam:  Filed Vitals:   03/17/12 1009 03/17/12 1013 03/17/12 1230  BP: 139/62  127/47  Pulse: 100  112  Temp: 97.7 F (36.5 C)  97.8 F (36.6 C)  TempSrc: Oral  Oral  Resp: 20  20  Height:  5\' 11"  (1.803 m)   Weight:  102.059 kg (225 lb)   SpO2: 100%  98%    Constitutional: Vital signs reviewed.  Patient is a well-developed and well-nourished in no acute distress and cooperative with exam. Alert and oriented x3.  Head: Normocephalic and atraumatic Ear: TM normal bilaterally Mouth: dehydrated Eyes: PERRL, EOMI, conjunctivae normal, No scleral icterus.  Neck: Supple, Trachea midline normal ROM, No JVD, mass, thyromegaly, or carotid bruit present.  Cardiovascular:  S1 normal, S2 tachycardic , no MRG, pulses symmetric and intact bilaterally Pulmonary/Chest: CTAB, no wheezes, rales, or rhonchi Abdominal: Soft. Non-tender, non-distended, bowel sounds are normal, no masses, organomegaly, or guarding present.  GU: no CVA tenderness Musculoskeletal: No joint deformities, erythema, or stiffness, ROM full and no  nontender Ext: no edema and no cyanosis, pulses palpable bilaterally (DP and PT) Hematology: no cervical, inginal, or axillary adenopathy.  Neurological: A&O x3, Strenght is normal and symmetric bilaterally, cranial nerve II-XII are grossly intact, no focal motor deficit, sensory intact to light touch bilaterally.  Skin: Warm, dry and intact. No rash, cyanosis, or clubbing.  Psychiatric: Normal mood and affect. speech and behavior is normal. Judgment and thought content normal. Cognition and memory are normal.   Labs on Admission:  Results for orders placed during the hospital encounter of 03/17/12 (from the past 48 hour(s))  GLUCOSE, CAPILLARY     Status: Abnormal   Collection Time   03/17/12 10:11 AM      Component Value Range Comment   Glucose-Capillary 492 (*) 70 - 99 mg/dL    Comment 1 Notify RN      Comment 2 Documented in Chart     URINALYSIS, ROUTINE W REFLEX MICROSCOPIC     Status: Abnormal   Collection Time   03/17/12 11:11 AM      Component Value Range Comment   Color, Urine  YELLOW  YELLOW    APPearance CLOUDY (*) CLEAR    Specific Gravity, Urine 1.029  1.005 - 1.030    pH 5.0  5.0 - 8.0    Glucose, UA >1000 (*) NEGATIVE mg/dL    Hgb urine dipstick NEGATIVE  NEGATIVE    Bilirubin Urine NEGATIVE  NEGATIVE    Ketones, ur >80 (*) NEGATIVE mg/dL    Protein, ur NEGATIVE  NEGATIVE mg/dL    Urobilinogen, UA 0.2  0.0 - 1.0 mg/dL    Nitrite NEGATIVE  NEGATIVE    Leukocytes, UA NEGATIVE  NEGATIVE   URINE MICROSCOPIC-ADD ON     Status: Abnormal   Collection Time   03/17/12 11:11 AM      Component Value Range Comment   Squamous Epithelial / LPF RARE  RARE    WBC, UA 3-6  <3 WBC/hpf    Bacteria, UA FEW (*) RARE    Urine-Other MUCOUS PRESENT     BASIC METABOLIC PANEL     Status: Abnormal   Collection Time   03/17/12 11:21 AM      Component Value Range Comment   Sodium 130 (*) 135 - 145 mEq/L REPEATED TO VERIFY   Potassium 5.6 (*) 3.5 - 5.1 mEq/L NO VISIBLE HEMOLYSIS   Chloride 90  (*) 96 - 112 mEq/L REPEATED TO VERIFY   CO2 11 (*) 19 - 32 mEq/L REPEATED TO VERIFY   Glucose, Bld 537 (*) 70 - 99 mg/dL    BUN 27 (*) 6 - 23 mg/dL    Creatinine, Ser 4.09  0.50 - 1.35 mg/dL    Calcium 9.2  8.4 - 81.1 mg/dL    GFR calc non Af Amer 66 (*) >90 mL/min    GFR calc Af Amer 77 (*) >90 mL/min   CBC     Status: Abnormal   Collection Time   03/17/12 11:21 AM      Component Value Range Comment   WBC 17.6 (*) 4.0 - 10.5 K/uL    RBC 5.48  4.22 - 5.81 MIL/uL    Hemoglobin 16.0  13.0 - 17.0 g/dL    HCT 91.4  78.2 - 95.6 %    MCV 83.9  78.0 - 100.0 fL    MCH 29.2  26.0 - 34.0 pg    MCHC 34.8  30.0 - 36.0 g/dL    RDW 21.3  08.6 - 57.8 %    Platelets 332  150 - 400 K/uL   TROPONIN I     Status: Normal   Collection Time   03/17/12 11:21 AM      Component Value Range Comment   Troponin I <0.30  <0.30 ng/mL   GLUCOSE, CAPILLARY     Status: Abnormal   Collection Time   03/17/12 12:33 PM      Component Value Range Comment   Glucose-Capillary 488 (*) 70 - 99 mg/dL    Comment 1 Notify RN      Comment 2 Documented in Chart     BLOOD GAS, ARTERIAL     Status: Abnormal   Collection Time   03/17/12 12:36 PM      Component Value Range Comment   FIO2 0.21      Delivery systems ROOM AIR      pH, Arterial 7.224 (*) 7.350 - 7.450    pCO2 arterial 21.8 (*) 35.0 - 45.0 mmHg    pO2, Arterial 107.0 (*) 80.0 - 100.0 mmHg    Bicarbonate 8.7 (*) 20.0 - 24.0 mEq/L  TCO2 7.9  0 - 100 mmol/L    Acid-base deficit 17.6 (*) 0.0 - 2.0 mmol/L    O2 Saturation 97.0      Patient temperature 98.6      Collection site RIGHT RADIAL      Drawn by [id number]      Sample type ARTERIAL      Allens test (pass/fail) PASS  PASS     Radiological Exams on Admission:   EKG: sinus tachy at 108, incomplete RBBB  Assessment/Plan 56 Y/O Male with type 2 DM on insulin with recently changed regimen, hyperlipidemia, hypothyridism  Presented with symptoms of generalized malaise, weakness and nausea / vomiting with  findings of severe DKA.   Marland Kitchen Severe DKA Patient presents with glucose >500, AG of 29, Low bicarb of 11, and urinary ketones suggestive of severe DKA. Admit to stepdown monitoring Start on glucose stabilizer protocol with Aggressive hydration with NS bolus 2 L over next 2 hours followed by 150 cc/ hr  -chem 7 every 2 Hours until AG closed. Start on insulin drop and monitor for fsg and AG.   start on 10 units lantus once AG closed and switch off the drip 2 hours later. - add kcl to IVNS once k <5.3. Add D5 1/2NS once fsg <250 -check mg and phos and replenish as needed. Does not require bicarbonate to be replenished. -no clear precipitation cause for DKA -Check repeat  A1C in am -clears for now and advance as tolerated -prn zofran for nausea -neuro checks -strict I/O monitoring  CAD Follows with Dr Myrtis Ser as outpatient. Non obstructive CAD on cardiac cath. Continue  statin . hold ACEi   Hypothyroidism  continue synthroid  DVT prophylaxis Sq lovenox  Diet:clears for now  Full code     Time Spent on Admission: 70 minutes  Lashawnda Hancox 03/17/2012, 2:27 PM

## 2012-03-17 NOTE — ED Notes (Signed)
ZOX:[id number]<AV> Expected date:03/17/12<BR> Expected time: 9:53 AM<BR> Means of arrival:Ambulance<BR> Comments:<BR> 66yoM,hyperglycemia 300s,n/v

## 2012-03-17 NOTE — ED Notes (Signed)
[facility] 445 

## 2012-03-17 NOTE — ED Notes (Signed)
Pt states he checked blood sugar this am and it was 300+, states he did not take insulin because he was too sick and did'nt want to

## 2012-03-18 DIAGNOSIS — I10 Essential (primary) hypertension: Secondary | ICD-10-CM

## 2012-03-18 LAB — GLUCOSE, CAPILLARY
Glucose-Capillary: 95 mg/dL (ref 70–99)
Glucose-Capillary: 113 mg/dL — ABNORMAL HIGH (ref 70–99)
Glucose-Capillary: 125 mg/dL — ABNORMAL HIGH (ref 70–99)
Glucose-Capillary: 124 mg/dL — ABNORMAL HIGH (ref 70–99)

## 2012-03-18 LAB — BASIC METABOLIC PANEL
BUN: 20 mg/dL (ref 6–23)
CO2: 21 mEq/L (ref 19–32)
Chloride: 110 mEq/L (ref 96–112)
Creatinine, Ser: 1.12 mg/dL (ref 0.50–1.35)
Glucose, Bld: 118 mg/dL — ABNORMAL HIGH (ref 70–99)

## 2012-03-18 LAB — CBC
HCT: 38.4 % — ABNORMAL LOW (ref 39.0–52.0)
Hemoglobin: 13.6 g/dL (ref 13.0–17.0)
MCH: 28.8 pg (ref 26.0–34.0)
MCHC: 35.4 g/dL (ref 30.0–36.0)
MCV: 81.2 fL (ref 78.0–100.0)
RDW: 13.2 % (ref 11.5–15.5)

## 2012-03-18 LAB — HEMOGLOBIN A1C: Mean Plasma Glucose: 192 mg/dL — ABNORMAL HIGH (ref <117)

## 2012-03-18 MED ORDER — INSULIN GLARGINE 100 UNIT/ML ~~LOC~~ SOLN
30.0000 [IU] | Freq: Every day | SUBCUTANEOUS | Status: DC
  Administered 2012-03-18 – 2012-03-19 (×2): 30 [IU] via SUBCUTANEOUS

## 2012-03-18 MED ORDER — INSULIN GLARGINE 100 UNIT/ML ~~LOC~~ SOLN
10.0000 [IU] | Freq: Every day | SUBCUTANEOUS | Status: DC
  Administered 2012-03-18: 10 [IU] via SUBCUTANEOUS

## 2012-03-18 MED ORDER — INSULIN ASPART 100 UNIT/ML ~~LOC~~ SOLN
0.0000 [IU] | Freq: Three times a day (TID) | SUBCUTANEOUS | Status: DC
  Administered 2012-03-18: 8 [IU] via SUBCUTANEOUS
  Administered 2012-03-18: 2 [IU] via SUBCUTANEOUS
  Administered 2012-03-18: 11 [IU] via SUBCUTANEOUS
  Administered 2012-03-19: 8 [IU] via SUBCUTANEOUS

## 2012-03-18 MED ORDER — ACETAMINOPHEN 325 MG PO TABS
650.0000 mg | ORAL_TABLET | ORAL | Status: DC | PRN
  Administered 2012-03-18: 650 mg via ORAL
  Filled 2012-03-18: qty 2

## 2012-03-18 MED ORDER — INSULIN GLARGINE 100 UNIT/ML ~~LOC~~ SOLN: 15.0000 [IU] | Freq: Every day | SUBCUTANEOUS | Status: DC

## 2012-03-18 MED ORDER — INSULIN ASPART 100 UNIT/ML ~~LOC~~ SOLN: 0.0000 [IU] | Freq: Three times a day (TID) | SUBCUTANEOUS | Status: DC

## 2012-03-18 MED ORDER — INSULIN ASPART 100 UNIT/ML ~~LOC~~ SOLN: 0.0000 [IU] | Freq: Every day | SUBCUTANEOUS | Status: DC

## 2012-03-18 MED ORDER — INSULIN GLARGINE 100 UNIT/ML ~~LOC~~ SOLN: 10.0000 [IU] | Freq: Every day | SUBCUTANEOUS | Status: DC

## 2012-03-18 MED ORDER — INSULIN ASPART 100 UNIT/ML ~~LOC~~ SOLN
0.0000 [IU] | Freq: Every day | SUBCUTANEOUS | Status: DC
  Administered 2012-03-18: 4 [IU] via SUBCUTANEOUS

## 2012-03-18 NOTE — Progress Notes (Signed)
Subjective: Feels better overall but still very tired. No nausea or vomiting. AG corrected overnight. Now off insulin rip and received 10 units lantus last evenight  Objective:  Vital signs in last 24 hours:  Filed Vitals:   03/17/12 2331 03/18/12 0000 03/18/12 0221 03/18/12 0416  BP: 122/56   117/56  Pulse: 91   77  Temp:  98.7 F (37.1 C)  99 F (37.2 C)  TempSrc:  Oral  Oral  Resp: 12   15  Height:      Weight:   104.2 kg (229 lb 11.5 oz)   SpO2: 98%   97%    Intake/Output from previous day:   Intake/Output Summary (Last 24 hours) at 03/18/12 0757 Last data filed at 03/18/12 0700  Gross per 24 hour  Intake 2589.94 ml  Output   1275 ml  Net 1314.94 ml    Physical Exam:  General:middle aged male  in no acute distress. Appears tired HEENT: no pallor, no icterus, moist oral mucosa, no JVD, no lymphadenopathy Heart: Normal  s1 &s2  Regular rate and rhythm, without murmurs, rubs, gallops. Lungs: Clear to auscultation bilaterally. Abdomen: Soft, nontender, nondistended, positive bowel sounds. Extremities: No clubbing cyanosis or edema with positive pedal pulses. Neuro: Alert, awake, oriented x3, nonfocal.   Lab Results:  Basic Metabolic Panel:    Component Value Date/Time   NA 138 03/18/2012 0317   K 4.0 03/18/2012 0317   CL 110 03/18/2012 0317   CO2 21 03/18/2012 0317   BUN 20 03/18/2012 0317   CREATININE 1.12 03/18/2012 0317   GLUCOSE 118* 03/18/2012 0317   CALCIUM 8.1* 03/18/2012 0317   CBC:    Component Value Date/Time   WBC 13.1* 03/18/2012 0317   HGB 13.6 03/18/2012 0317   HCT 38.4* 03/18/2012 0317   PLT 270 03/18/2012 0317   MCV 81.2 03/18/2012 0317    No results found for this or any previous visit (from the past 240 hour(s)).  Studies/Results: No results found.  Medications: Scheduled Meds:   . aspirin  81 mg Oral Daily  . atorvastatin  80 mg Oral Daily  . cholecalciferol  3,000 Units Oral Daily  . enoxaparin (LOVENOX) injection  40 mg Subcutaneous Q24H  .  insulin glargine  10 Units Subcutaneous Daily  . levothyroxine  175 mcg Oral Daily  . niacin  1,500 mg Oral QHS  . omega-3 acid ethyl esters  1 g Oral Daily  . ondansetron (ZOFRAN) IV  4 mg Intravenous Once  . potassium chloride  10 mEq Intravenous Q1H  . ramipril  5 mg Oral Daily  . sodium chloride  2,000 mL Intravenous Once  . sodium chloride  3 mL Intravenous Q12H  . DISCONTD: enoxaparin (LOVENOX) injection  40 mg Subcutaneous Q24H  . DISCONTD: insulin glargine  10 Units Subcutaneous Daily   Continuous Infusions:   . sodium chloride 999 mL/hr at 03/17/12 1600  . sodium chloride    . dextrose 5 % and 0.45% NaCl 75 mL/hr at 03/18/12 0745  . insulin (NOVOLIN-R) infusion 8.8 Units/hr (03/17/12 2010)  . insulin (NOVOLIN-R) infusion 2 Units/hr (03/18/12 6213)  . DISCONTD: sodium chloride 1,000 mL (03/17/12 1314)  . DISCONTD: sodium chloride    . DISCONTD: sodium chloride    . DISCONTD: dextrose 5 % and 0.45% NaCl     PRN Meds:.acetaminophen, bismuth subsalicylate, dextrose, ondansetron (ZOFRAN) IV, ondansetron, DISCONTD: ondansetron (ZOFRAN) IV  Assessment/Plan: 56 Y/O Male with type 2 DM on insulin with recently changed regimen, hyperlipidemia,  hypothyridism Presented with symptoms of generalized malaise, weakness and nausea / vomiting with findings of severe DKA.   Marland Kitchen Severe DKA  Patient presented with glucose >500, AG of 29, Low bicarb of 11, and urinary ketones suggestive of severe DKA.  Admitted to stepdown monitoring  Started on glucose stabilizer protocol with Aggressive hydration with NS bolus 2 L over 2 hours followed by 150 cc/ hr  -AG closed and now off insulin drip. fsg of 124 this am. .switch fluids to NS at 100 cc/hr and start sliding scale insulin  -no clear precipitation cause for DKA  -repeat A1C pending. -no further nausea or vomiting. Advance diet to diabetic -prn zofran for nausea  -patient on Humalog mix insulin which was recent changed. Can switch to lantus  once daily with premeal coverage  for convenience. Will need to discuss this with his PCP.  -consult diabetic coordinator   CAD  Follows with Dr Myrtis Ser as outpatient. Non obstructive CAD on cardiac cath. Continue statin and ACEi  Hypothyroidism  continue synthroid   DVT prophylaxis  Sq lovenox    Full code  transfer out to medical floor    LOS: 1 day   Fortunata Betty 03/18/2012, 7:57 AM

## 2012-03-18 NOTE — Progress Notes (Signed)
Inpatient Diabetes Program Recommendations  AACE/ADA: New Consensus Statement on Inpatient Glycemic Control (2009)  Target Ranges:  Prepandial:   less than 140 mg/dL      Peak postprandial:   less than 180 mg/dL (1-2 hours)      Critically ill patients:  140 - 180 mg/dL   Reason for Visit: DKA - Diabetes Consult  56 year old male with hx of type 2 DM on insulin ( last A1C on 7.1 about 2 months back), HL, HTN, hypothyroidism , nonobstructive CAD with normall cath in 03( Follows with Dr Myrtis Ser), presented to ED with generalized weakness , malaise with nausea since past 4 days. He informs having poor appetite, feeling sweaty and increased thirst and frequency on urination. He also had 2 episodes of vomiting since yesterday. Since past 4 days he has been noticing his fsg in 300s while it normally runs between 150-200. He recently had his insulin regimen switched by his PCP to 75/25 50 ac Breakfast and 40 ac Dinner.    Results for Joseph Kirby, Joseph Kirby (MRN [id number]) as of 03/18/2012 15:03  Ref. Range 03/18/2012 01:15 03/18/2012 02:15 03/18/2012 03:11 03/18/2012 04:16 03/18/2012 05:17 03/18/2012 06:36 03/18/2012 07:54 03/18/2012 12:46  Glucose-Capillary Latest Range: 70-99 mg/dL 409 (H) 811 (H) 914 (H) 95 113 (H) 125 (H) 124 (H) 273 (H)     Inpatient Diabetes Program Recommendations Insulin - Basal: Add Lantus 30 units QHS Correction (SSI): Add HS correction Insulin - Meal Coverage: Add Novolog 6 units tidwc if pt eats > 50% meal  Note: Pt checks blood sugars several times/day at home and states blood sugars are controlled most of the time, although it does get up to 200s when he overeats. Very motivated to control blood sugars. Needs increase in basal insulin tonight. Recommend Lantus 30 units QHS starting tonight.  Will follow in am.

## 2012-03-18 NOTE — Progress Notes (Signed)
UR complete 

## 2012-03-19 DIAGNOSIS — E785 Hyperlipidemia, unspecified: Secondary | ICD-10-CM

## 2012-03-19 DIAGNOSIS — I2510 Atherosclerotic heart disease of native coronary artery without angina pectoris: Secondary | ICD-10-CM

## 2012-03-19 LAB — BASIC METABOLIC PANEL
BUN: 14 mg/dL (ref 6–23)
CO2: 22 mEq/L (ref 19–32)
Chloride: 102 mEq/L (ref 96–112)
Creatinine, Ser: 0.94 mg/dL (ref 0.50–1.35)
GFR calc Af Amer: >90 mL/min (ref >90)
Potassium: 3.7 mEq/L (ref 3.5–5.1)

## 2012-03-19 LAB — CBC
HCT: 36.8 % — ABNORMAL LOW (ref 39.0–52.0)
Hemoglobin: 12.9 g/dL — ABNORMAL LOW (ref 13.0–17.0)
MCHC: 35.1 g/dL (ref 30.0–36.0)
MCV: 81.6 fL (ref 78.0–100.0)
RDW: 13.1 % (ref 11.5–15.5)

## 2012-03-19 LAB — GLUCOSE, CAPILLARY
Glucose-Capillary: 302 mg/dL — ABNORMAL HIGH (ref 70–99)
Glucose-Capillary: 275 mg/dL — ABNORMAL HIGH (ref 70–99)

## 2012-03-19 MED ORDER — INSULIN ASPART 100 UNIT/ML ~~LOC~~ SOLN
0.0000 [IU] | Freq: Every day | SUBCUTANEOUS | Status: DC
  Administered 2012-03-19 – 2012-03-20 (×2): 3 [IU] via SUBCUTANEOUS

## 2012-03-19 MED ORDER — INSULIN ASPART 100 UNIT/ML ~~LOC~~ SOLN
0.0000 [IU] | Freq: Three times a day (TID) | SUBCUTANEOUS | Status: DC
  Administered 2012-03-19 – 2012-03-20 (×4): 8 [IU] via SUBCUTANEOUS
  Administered 2012-03-20: 11 [IU] via SUBCUTANEOUS
  Administered 2012-03-21: 8 [IU] via SUBCUTANEOUS
  Administered 2012-03-21: 5 [IU] via SUBCUTANEOUS

## 2012-03-19 MED ORDER — INSULIN ASPART 100 UNIT/ML ~~LOC~~ SOLN
8.0000 [IU] | Freq: Three times a day (TID) | SUBCUTANEOUS | Status: DC
  Administered 2012-03-20: 8 [IU] via SUBCUTANEOUS

## 2012-03-19 MED ORDER — INSULIN ASPART 100 UNIT/ML ~~LOC~~ SOLN
6.0000 [IU] | Freq: Three times a day (TID) | SUBCUTANEOUS | Status: DC
  Administered 2012-03-19 (×2): 6 [IU] via SUBCUTANEOUS

## 2012-03-19 MED ORDER — INSULIN ASPART 100 UNIT/ML ~~LOC~~ SOLN
2.0000 [IU] | Freq: Once | SUBCUTANEOUS | Status: AC
  Administered 2012-03-19: 2 [IU] via SUBCUTANEOUS

## 2012-03-19 NOTE — Progress Notes (Signed)
INITIAL ADULT NUTRITION ASSESSMENT Date: 03/19/2012   Time: 2:02 PM Reason for Assessment: Poor intake   INTERVENTION: Recommend Glucerna shakes, however pt not interested. Encouraged gradual increased intake as nausea improves.   ASSESSMENT: Male 56 y.o.  Dx: DKA (diabetic ketoacidoses)  Food/Nutrition Related Hx: Pt reports eating what he wants to at home. Pt started developing nausea/vomiting Thursday PTA and reports 10-15 pound unintended weight loss since then. Pt did not eat any breakfast this morning r/t feeling nauseated, however currently denies any nausea.   Hx:  Past Medical History  Diagnosis Date  . BPH (benign prostatic hyperplasia)   . Colon polyp   . Coronary artery disease   . Hypertension   . Hyperlipidemia   . Diabetes mellitus   . Hypothyroidism    Related Meds:  Scheduled Meds:   . aspirin  81 mg Oral Daily  . atorvastatin  80 mg Oral Daily  . cholecalciferol  3,000 Units Oral Daily  . enoxaparin (LOVENOX) injection  40 mg Subcutaneous Q24H  . insulin aspart  0-15 Units Subcutaneous TID WC  . insulin aspart  0-5 Units Subcutaneous QHS  . insulin aspart  6 Units Subcutaneous TID WC  . insulin glargine  30 Units Subcutaneous QHS  . levothyroxine  175 mcg Oral Daily  . niacin  1,500 mg Oral QHS  . omega-3 acid ethyl esters  1 g Oral Daily  . ramipril  5 mg Oral Daily  . sodium chloride  3 mL Intravenous Q12H  . DISCONTD: insulin aspart  0-15 Units Subcutaneous TID WC  . DISCONTD: insulin aspart  0-15 Units Subcutaneous TID WC  . DISCONTD: insulin aspart  0-5 Units Subcutaneous QHS  . DISCONTD: insulin aspart  0-5 Units Subcutaneous QHS  . DISCONTD: insulin glargine  10 Units Subcutaneous Daily  . DISCONTD: insulin glargine  15 Units Subcutaneous Daily   Continuous Infusions:   . sodium chloride 100 mL/hr at 03/19/12 0344   PRN Meds:.acetaminophen, bismuth subsalicylate, dextrose, ondansetron  Ht: 5\' 11"  (180.3 cm)  Wt: 229 lb 11.5 oz (104.2  kg)  Ideal Wt: 172 lb % Ideal Wt: 133  Usual Wt: 239-244 lb % Usual Wt: 94-96  Body mass index is 32.04 kg/(m^2). Class I obesity   Labs:  CMP     Component Value Date/Time   NA 133* 03/19/2012 0320   K 3.7 03/19/2012 0320   CL 102 03/19/2012 0320   CO2 22 03/19/2012 0320   GLUCOSE 277* 03/19/2012 0320   BUN 14 03/19/2012 0320   CREATININE 0.94 03/19/2012 0320   CALCIUM 8.0* 03/19/2012 0320   GFRNONAA >90 03/19/2012 0320   GFRAA >90 03/19/2012 0320   Lab Results  Component Value Date   HGBA1C 8.3* 03/18/2012   CBG (last 3)   Basename 03/19/12 1158 03/19/12 0816 03/19/12 0744  GLUCAP 276* 302* 283*    Intake/Output Summary (Last 24 hours) at 03/19/12 1407 Last data filed at 03/18/12 1826  Gross per 24 hour  Intake    440 ml  Output    700 ml  Net   -260 ml   Last BM - 03/18/12  Diet Order: Carb Control   IVF:    sodium chloride Last Rate: 100 mL/hr at 03/19/12 0344    Estimated Nutritional Needs:   Kcal:1950-2350 Protein:80-95g Fluid:1.9-2.3L  NUTRITION DIAGNOSIS: -Inadequate oral intake (NI-2.1).  Status: Ongoing -Pt meets criteria for severe PCM of acute illness AEB 4.2-6.1% weight loss with <50% estimated energy intake for the past week PTA  per pt report   RELATED TO: nausea from DKA  AS EVIDENCE BY: <25% meal intake  MONITORING/EVALUATION(Goals): Pt to consume >90% of meals.   EDUCATION NEEDS: -No education needs identified at this  - pt refused DM education.   Dietitian #: [phone]  DOCUMENTATION CODES Per approved criteria  -Severe malnutrition in the context of acute illness or injury    Marshall Cork 03/19/2012, 2:02 PM

## 2012-03-19 NOTE — Progress Notes (Signed)
[facility] PROGRESS NOTE  PHYLLIP CLAW ZOX:[id number] DOB: 1955-12-21 DOA: 03/17/2012 PCP: Rudi Heap, MD  Brief narrative: 56 Y/O Male with type 2 DM on insulin with recently changed regimen, hyperlipidemia, hypothyridism Presented with symptoms of generalized malaise, weakness and nausea / vomiting with findings of severe DKA.   Assessment and Plan  Principal Problem: *Severe DKA secondary to uncontrolled diabetes mellitus  - Patient presented with glucose >500, AG of 29, Low bicarb of 11, and urinary ketones suggestive of severe DKA.  - AG closed at this time and bicarb 22 but CBG continue to be 276, 302, 283, 328 - started Lantus 30 units sub Q at bedtime and 6 units TIDAC - appreciate diabetic coordinator input and nutrition consult  - A1c 8.3 - prn zofran for nausea   Active Problems: Dyslipidemia - continue atorvastatin  CAD  - Non obstructive CAD on cardiac cath.  - Continue statin and ACEi   Hypothyroidism  - continue synthroid   DVT prophylaxis  Sq lovenox   Code: full code Family education: none at bedside Disposition: likely home when stable  Consults: 1. Diabetic coordinator 2. Nutrition  Antibiotics: None  Procedures: none  Manson Passey, MD  [facility] Pager [phone]  If 7PM-7AM, please contact night-coverage [email or web address] Password [email or web address] 03/19/2012, 1:37 PM   LOS: 2 days   HPI/Subjective: No acute events overnight.  Objective: Filed Vitals:   03/18/12 1325 03/18/12 1600 03/18/12 2122 03/19/12 0641  BP: 144/63  138/70 136/79  Pulse: 97  79 65  Temp:  99 F (37.2 C) 98.7 F (37.1 C) 97.4 F (36.3 C)  TempSrc:  Oral Oral Oral  Resp: 15  16 16   Height:      Weight:      SpO2: 98%  97% 97%    Intake/Output Summary (Last 24 hours) at 03/19/12 1337 Last data filed at 03/18/12 1826  Gross per 24 hour  Intake    540 ml  Output    700 ml  Net   -160 ml    Exam:   General:  Pt is alert, follows commands  appropriately, not in acute distress  Cardiovascular: Regular rate and rhythm, S1/S2, no murmurs, no rubs, no gallops  Respiratory: Clear to auscultation bilaterally, no wheezing, no crackles, no rhonchi  Abdomen: Soft, non tender, non distended, bowel sounds present, no guarding  Extremities: No edema, pulses DP and PT palpable bilaterally  Neuro: Grossly nonfocal  Data Reviewed: Basic Metabolic Panel:  Lab 03/19/12 8295 03/18/12 0317 03/17/12 2143 03/17/12 1759 03/17/12 1651  NA 133* 138 140 138 136  K 3.7 4.0 4.4 4.6 4.7  CL 102 110 109 103 100  CO2 22 21 16* 11* 11*  GLUCOSE 277* 118* 236* 357* 431*  BUN 14 20 23  27* 29*  CREATININE 0.94 1.12 1.19 1.25 1.27  CALCIUM 8.0* 8.1* 8.2* 8.3* 8.6   CBC:  Lab 03/19/12 0320 03/18/12 0317 03/17/12 1121  WBC 6.9 13.1* 17.6*  HGB 12.9* 13.6 16.0  HCT 36.8* 38.4* 46.0  MCV 81.6 81.2 83.9  PLT 219 270 332   Cardiac Enzymes:  Lab 03/17/12 1121  CKTOTAL --  CKMB --  CKMBINDEX --  TROPONINI <0.30   CBG:  Lab 03/19/12 1158 03/19/12 0816 03/19/12 0744 03/18/12 2133 03/18/12 1709  GLUCAP 276* 302* 283* 328* 308*    Studies: No results found.  Scheduled Meds:   . aspirin  81 mg Oral Daily  . atorvastatin  80 mg Oral Daily  .  cholecalciferol  3,000 Units Oral Daily  . enoxaparin (LOVENOX) inj  40 mg Subcutaneous Q24H  . insulin aspart  0-15 Units Subcutaneous TID WC  . insulin aspart  0-5 Units Subcutaneous QHS  . insulin aspart  6 Units Subcutaneous TID WC  . insulin glargine  30 Units Subcutaneous QHS  . levothyroxine  175 mcg Oral Daily  . niacin  1,500 mg Oral QHS  . omega-3 acid ethyl esters  1 g Oral Daily  . ramipril  5 mg Oral Daily   Continuous Infusions:   . sodium chloride 100 mL/hr at 03/19/12 0344

## 2012-03-19 NOTE — Progress Notes (Signed)
Nutrition Consult Note  - Received consult for diet education for DM. Met with pt who reports he has been a diabetic since the 1980s and does not have any questions regarding the diabetic diet. Pt states he eats what he wants to at home. Pt knows that he needs to work on monitoring his portion sizes. Pt aware of how bad he feels when his blood sugars are out of control, however still not interested in making any drastic diet changes. Provided diabetic diet handout in case pt interested in reading. Please re-consult if pt changes mind for diet education.   Dietitian# [phone]

## 2012-03-19 NOTE — Progress Notes (Signed)
Inpatient Diabetes Program Recommendations  AACE/ADA: New Consensus Statement on Inpatient Glycemic Control (2009)  Target Ranges:  Prepandial:   less than 140 mg/dL      Peak postprandial:   less than 180 mg/dL (1-2 hours)      Critically ill patients:  140 - 180 mg/dL   Reason for Visit: Hyperglycemia  Results for Joseph Kirby, Joseph Kirby (MRN [id number]) as of 03/19/2012 14:02  Ref. Range 03/19/2012 03:20  Sodium Latest Range: 135-145 mEq/L 133 (L)  Potassium Latest Range: 3.5-5.1 mEq/L 3.7  Chloride Latest Range: 96-112 mEq/L 102  CO2 Latest Range: 19-32 mEq/L 22  BUN Latest Range: 6-23 mg/dL 14  Creat Latest Range: 0.50-1.35 mg/dL 4.09  Calcium Latest Range: 8.4-10.5 mg/dL 8.0 (L)  GFR calc non Af Amer Latest Range: >90 mL/min >90  GFR calc Af Amer Latest Range: >90 mL/min >90  Glucose Latest Range: 70-99 mg/dL 811 (H)  WBC Latest Range: 4.0-10.5 K/uL 6.9  RBC Latest Range: 4.22-5.81 MIL/uL 4.51  Hemoglobin Latest Range: 13.0-17.0 g/dL 91.4 (L)  HCT Latest Range: 39.0-52.0 % 36.8 (L)  MCV Latest Range: 78.0-100.0 fL 81.6  MCH Latest Range: 26.0-34.0 pg 28.6  MCHC Latest Range: 30.0-36.0 g/dL 78.2  RDW Latest Range: 11.5-15.5 % 13.1  Platelets Latest Range: 150-400 K/uL 219  Results for Joseph Kirby, Joseph Kirby (MRN [id number]) as of 03/19/2012 14:02  Ref. Range 03/18/2012 17:09 03/18/2012 21:33 03/19/2012 07:44 03/19/2012 08:16 03/19/2012 11:58  Glucose-Capillary Latest Range: 70-99 mg/dL 578 (H) 469 (H) 629 (H) 302 (H) 276 (H)   NEEDS MORE BASAL INSULIN!  Inpatient Diabetes Program Recommendations Insulin - Basal: D/C Lantus -NPH 40 units ac Dinner today and increase to NPH 50 units in am and NPH 40 units ac Dinner (previous home dose) Correction (SSI): Increase Novolog to resistant tidwc and hs Insulin - Meal Coverage: Increase meal coverage insulin Novolog to 10 units ac Breakfast and 10 units ac Dinner (previous home dose)  Note: Will follow.

## 2012-03-19 NOTE — Progress Notes (Signed)
At 0800 pt c/o increased fatigue, hot, sweaty feeling, nausea. CBG 283 with in few minutes before c/o. Medicated for nausea & SSI for CBG. Fan received from portable equipment. Rechecked CBG to varify & = 302. After 20 Min pt denies nausea. Not feeling hot, sweaty. Still w/increased fatigue. Will continue to monitor.Hartley Barefoot

## 2012-03-19 NOTE — Progress Notes (Signed)
CBG values for 1200 & 1700 given to DR Elisabeth Pigeon. Informed of new recommendation from DM Coordinator for increase in Basal Insulin. Order received for change of Meal coverage Novolog to 8 units.Joseph Kirby

## 2012-03-20 LAB — GLUCOSE, CAPILLARY
Glucose-Capillary: 250 mg/dL — ABNORMAL HIGH (ref 70–99)
Glucose-Capillary: 261 mg/dL — ABNORMAL HIGH (ref 70–99)
Glucose-Capillary: 252 mg/dL — ABNORMAL HIGH (ref 70–99)
Glucose-Capillary: 255 mg/dL — ABNORMAL HIGH (ref 70–99)

## 2012-03-20 MED ORDER — GLUCERNA SHAKE PO LIQD
237.0000 mL | Freq: Three times a day (TID) | ORAL | Status: DC
  Administered 2012-03-20: 237 mL via ORAL
  Filled 2012-03-20 (×6): qty 237

## 2012-03-20 MED ORDER — INSULIN ASPART 100 UNIT/ML ~~LOC~~ SOLN
12.0000 [IU] | Freq: Three times a day (TID) | SUBCUTANEOUS | Status: DC
  Administered 2012-03-20 – 2012-03-21 (×3): 12 [IU] via SUBCUTANEOUS

## 2012-03-20 MED ORDER — INSULIN GLARGINE 100 UNIT/ML ~~LOC~~ SOLN
34.0000 [IU] | Freq: Every day | SUBCUTANEOUS | Status: DC
  Administered 2012-03-20: 34 [IU] via SUBCUTANEOUS

## 2012-03-20 MED ORDER — LORAZEPAM 1 MG PO TABS
1.0000 mg | ORAL_TABLET | Freq: Two times a day (BID) | ORAL | Status: DC | PRN
  Administered 2012-03-20 (×2): 1 mg via ORAL
  Filled 2012-03-20 (×2): qty 1

## 2012-03-20 NOTE — Progress Notes (Addendum)
Wife Dorinda would like a phone call from MD to update her on patient's plan of care.  She has numerous questions.  MD aware.  If wife is not available, wife is requesting that son Joseph Kirby (on Contact Info tab) be updated.

## 2012-03-20 NOTE — Progress Notes (Signed)
[facility] PROGRESS NOTE  ALEM FAHL WUJ:[id number] DOB: Apr 30, 1956 DOA: 03/17/2012 PCP: Rudi Heap, MD  Brief narrative:  56 Y/O Male with type 2 DM on insulin with recently changed regimen, hyperlipidemia, hypothyridism Presented with symptoms of generalized malaise, weakness and nausea / vomiting with findings of severe DKA.   Assessment and Plan   Principal Problem:  *Severe DKA secondary to uncontrolled diabetes mellitus  - Patient presented with glucose >500, AG of 29, Low bicarb of 11, and urinary ketones suggestive of severe DKA.  - AG closed at this time and bicarb 22  - started Lantus 30 units sub Q at bedtime and 6 units TIDAC but this has been increased to 34 units and 12 units respectively for better glycemic control - appreciate diabetic coordinator input and nutrition consult  - A1c 8.3  - prn zofran for nausea   Active Problems:  Dyslipidemia  - continue atorvastatin  CAD  - Non obstructive CAD on cardiac cath.  - Continue statin and ACEi  Hypothyroidism  - continue synthroid  DVT prophylaxis  Sq lovenox   Code: full code  Family education: none at bedside  Disposition: likely home when stable   Consults:  1. Diabetic coordinator  2. Nutrition  Antibiotics:  None  Procedures:  none  Manson Passey, MD  [facility] Pager [phone]  If 7PM-7AM, please contact night-coverage [email or web address] Password [email or web address] 03/20/2012, 5:15 PM   LOS: 3 days   HPI/Subjective: No acute events overnight.  Objective: Filed Vitals:   03/19/12 1422 03/19/12 2151 03/20/12 0620 03/20/12 1320  BP: 140/71 151/75 128/72 119/69  Pulse: 88 70 108 99  Temp: 97.9 F (36.6 C) 97.9 F (36.6 C) 97.7 F (36.5 C) 98.8 F (37.1 C)  TempSrc: Oral Oral Oral Oral  Resp: 20 18 16 18   Height:      Weight:      SpO2: 98% 97% 96% 97%    Intake/Output Summary (Last 24 hours) at 03/20/12 1715 Last data filed at 03/20/12 0100  Gross per 24 hour  Intake     120 ml  Output      0 ml  Net    120 ml    Exam:   General:  Pt is alert, follows commands appropriately, not in acute distress  Cardiovascular: Regular rate and rhythm, S1/S2, no murmurs, no rubs, no gallops  Respiratory: Clear to auscultation bilaterally, no wheezing, no crackles, no rhonchi  Abdomen: Soft, non tender, non distended, bowel sounds present, no guarding  Extremities: No edema, pulses DP and PT palpable bilaterally  Neuro: Grossly nonfocal  Data Reviewed: Basic Metabolic Panel:  Lab 03/19/12 8657 03/18/12 0317 03/17/12 2143 03/17/12 1759 03/17/12 1651 03/17/12 1437  NA 133* 138 140 138 136 --  K 3.7 4.0 4.4 4.6 4.7 --  CL 102 110 109 103 100 --  CO2 22 21 16* 11* 11* --  GLUCOSE 277* 118* 236* 357* 431* --  BUN 14 20 23  27* 29* --  CREATININE 0.94 1.12 1.19 1.25 1.27 --  CALCIUM 8.0* 8.1* 8.2* 8.3* 8.6 --  MG -- -- -- -- -- 1.9  PHOS -- -- -- -- -- 4.2   Liver Function Tests: No results found for this basename: AST:5,ALT:5,ALKPHOS:5,BILITOT:5,PROT:5,ALBUMIN:5 in the last 168 hours No results found for this basename: LIPASE:5,AMYLASE:5 in the last 168 hours No results found for this basename: AMMONIA:5 in the last 168 hours CBC:  Lab 03/19/12 0320 03/18/12 0317 03/17/12 1121  WBC 6.9 13.1* 17.6*  HGB 12.9* 13.6 16.0  HCT 36.8* 38.4* 46.0  MCV 81.6 81.2 83.9  PLT 219 270 332   Cardiac Enzymes:  Lab 03/17/12 1121  CKTOTAL --  CKMB --  CKMBINDEX --  TROPONINI <0.30   BNP: No components found with this basename: POCBNP:5 CBG:  Lab 03/20/12 1653 03/20/12 1150 03/20/12 0749 03/20/12 0443 03/19/12 2149  GLUCAP 252* 261* 306* 250* 267*    No results found for this or any previous visit (from the past 240 hour(s)).   Studies: No results found.  Scheduled Meds:   . aspirin  81 mg Oral Daily  . atorvastatin  80 mg Oral Daily  . cholecalciferol  3,000 Units Oral Daily  . enoxaparin (LOVENOX) injection  40 mg Subcutaneous Q24H  . feeding  supplement  237 mL Oral TID BM  . insulin aspart  0-15 Units Subcutaneous TID WC  . insulin aspart  0-5 Units Subcutaneous QHS  . insulin aspart  12 Units Subcutaneous TID WC  . insulin aspart  2 Units Subcutaneous Once  . insulin glargine  34 Units Subcutaneous QHS  . levothyroxine  175 mcg Oral Daily  . niacin  1,500 mg Oral QHS  . omega-3 acid ethyl esters  1 g Oral Daily  . ramipril  5 mg Oral Daily  . sodium chloride  3 mL Intravenous Q12H   Continuous Infusions:   . sodium chloride 100 mL/hr (03/20/12 1126)

## 2012-03-21 DIAGNOSIS — N400 Enlarged prostate without lower urinary tract symptoms: Secondary | ICD-10-CM

## 2012-03-21 LAB — GLUCOSE, CAPILLARY

## 2012-03-21 MED ORDER — INSULIN NPH (HUMAN) (ISOPHANE) 100 UNIT/ML ~~LOC~~ SUSP
40.0000 [IU] | Freq: Every day | SUBCUTANEOUS | Status: DC
  Filled 2012-03-21: qty 10

## 2012-03-21 MED ORDER — INSULIN NPH (HUMAN) (ISOPHANE) 100 UNIT/ML ~~LOC~~ SUSP: 50.0000 [IU] | Freq: Every day | SUBCUTANEOUS | Status: DC

## 2012-03-21 MED ORDER — INSULIN NPH (HUMAN) (ISOPHANE) 100 UNIT/ML ~~LOC~~ SUSP: 40.0000 [IU] | Freq: Every day | SUBCUTANEOUS | Status: DC

## 2012-03-21 MED ORDER — LORAZEPAM 1 MG PO TABS: 1.0000 mg | ORAL_TABLET | Freq: Two times a day (BID) | ORAL | Status: AC | PRN

## 2012-03-21 MED ORDER — INSULIN NPH (HUMAN) (ISOPHANE) 100 UNIT/ML ~~LOC~~ SUSP
50.0000 [IU] | Freq: Every day | SUBCUTANEOUS | Status: DC
  Filled 2012-03-21: qty 10

## 2012-03-21 NOTE — Discharge Summary (Signed)
Physician Discharge Summary  MASAYUKI SAKAI ZOX:[id number] DOB: 01/04/1956 DOA: 03/17/2012  PCP: Rudi Heap, MD  Admit date: 03/17/2012 Discharge date: 03/21/2012  Discharge Condition: medically stable and clinically appears well for discharge home today  Diet recommendation: carb modified  History of present illness:  56 Y/O Male with type 2 DM on insulin with recently changed regimen, hyperlipidemia, hypothyridism Presented with symptoms of generalized malaise, weakness and nausea / vomiting with findings of severe DKA.   Assessment and Plan   Principal Problem:  *Severe DKA secondary to uncontrolled diabetes mellitus  - Patient presented with glucose >500, AG of 29, Low bicarb of 11, and urinary ketones suggestive of severe DKA.  - AG closed at this time and bicarb 22  - started Lantus 30 units sub Q at bedtime and 6 units TIDAC but this has been increased to 34 units and 12 units respectively for better glycemic control  - appreciate diabetic coordinator input and nutrition consult - now we changed the regimen to NPH 50 units in am and 40 U at bedtime - A1c 8.3  - prn zofran for nausea   Active Problems:  Dyslipidemia  - continue atorvastatin  CAD  - Non obstructive CAD on cardiac cath.  - Continue statin and ACEi  Hypothyroidism  - continue synthroid  DVT prophylaxis  Sq lovenox while in hospital  Code: full code  Family education: none at bedside  Disposition:  Home today; patient's family is aware of D/C plan and is in agreement with D/C today  Consults:  1. Diabetic coordinator  2. Nutrition  Antibiotics:  None  Procedures:  none  Discharge Exam: Filed Vitals:   03/21/12 1455  BP: 141/76  Pulse: 71  Temp: 97.6 F (36.4 C)  Resp: 16   Filed Vitals:   03/20/12 1320 03/20/12 2037 03/21/12 0514 03/21/12 1455  BP: 119/69 127/70 121/67 141/76  Pulse: 99 75 73 71  Temp: 98.8 F (37.1 C) 97.9 F (36.6 C) 97.4 F (36.3 C) 97.6 F (36.4 C)  TempSrc: Oral  Oral Oral Oral  Resp: 18 16 16 16   Height:      Weight:      SpO2: 97% 97% 95% 95%    General: Pt is alert, follows commands appropriately, not in acute distress Cardiovascular: Regular rate and rhythm, S1/S2 +, no murmurs, no rubs, no gallops Respiratory: Clear to auscultation bilaterally, no wheezing, no crackles, no rhonchi Abdominal: Soft, non tender, non distended, bowel sounds +, no guarding Extremities: no edema, no cyanosis, pulses palpable bilaterally DP and PT Neuro: Grossly nonfocal  Discharge Instructions  Discharge Orders    Future Orders Please Complete By Expires   Diet - low sodium heart healthy      Increase activity slowly      Call MD for:  persistant nausea and vomiting      Call MD for:  severe uncontrolled pain      Call MD for:  difficulty breathing, headache or visual disturbances      Call MD for:  persistant dizziness or light-headedness        Medication List  As of 03/21/2012  3:14 PM   TAKE these medications         aspirin 81 MG tablet   Take 81 mg by mouth daily.      atorvastatin 80 MG tablet   Commonly known as: LIPITOR   Take 80 mg by mouth daily. Every other day      bismuth subsalicylate 262  MG/15ML suspension   Commonly known as: PEPTO BISMOL   Take 15 mLs by mouth every 6 (six) hours as needed. For upset stomach      cholecalciferol 1000 UNITS tablet   Commonly known as: VITAMIN D   Take 3,000 Units by mouth daily.      HUMALOG MIX 75/25 (75-25) 100 UNIT/ML Susp   Generic drug: insulin lispro protamine-insulin lispro   Inject 40-50 Units into the skin 2 (two) times daily. Inject 50 units in the morning and 40 units in the evening.      insulin NPH 100 UNIT/ML injection   Commonly known as: HUMULIN N,NOVOLIN N   Inject 40 Units into the skin at bedtime.      insulin NPH 100 UNIT/ML injection   Commonly known as: HUMULIN N,NOVOLIN N   Inject 50 Units into the skin daily before breakfast.      levothyroxine 175 MCG tablet    Commonly known as: SYNTHROID, LEVOTHROID   Take 175 mcg by mouth daily.      LORazepam 1 MG tablet   Commonly known as: ATIVAN   Take 1 tablet (1 mg total) by mouth 2 (two) times daily as needed for anxiety.      naproxen sodium 220 MG tablet   Commonly known as: ANAPROX   Take 220 mg by mouth 2 (two) times daily with a meal.      niacin 500 MG CR tablet   Commonly known as: NIASPAN   Take 1,500 mg by mouth at bedtime. Take 3 tabs at night      omega-3 acid ethyl esters 1 G capsule   Commonly known as: LOVAZA   Take 1 g by mouth daily.      ramipril 5 MG capsule   Commonly known as: ALTACE   Take 5 mg by mouth daily.           Follow-up Information    Follow up with Rudi Heap, MD in 1 week.   Contact information:   [address] [place] [address] [phone]           The results of significant diagnostics from this hospitalization (including imaging, microbiology, ancillary and laboratory) are listed below for reference.    Significant Diagnostic Studies: No results found.  Labs: Basic Metabolic Panel:  Lab 03/19/12 1324 03/18/12 0317 03/17/12 2143 03/17/12 1759 03/17/12 1651 03/17/12 1437  NA 133* 138 140 138 136 --  K 3.7 4.0 4.4 4.6 4.7 --  CL 102 110 109 103 100 --  CO2 22 21 16* 11* 11* --  GLUCOSE 277* 118* 236* 357* 431* --  BUN 14 20 23  27* 29* --  CREATININE 0.94 1.12 1.19 1.25 1.27 --  CALCIUM 8.0* 8.1* 8.2* 8.3* 8.6 --   CBC:  Lab 03/19/12 0320 03/18/12 0317 03/17/12 1121  WBC 6.9 13.1* 17.6*  HGB 12.9* 13.6 16.0  HCT 36.8* 38.4* 46.0  MCV 81.6 81.2 83.9  PLT 219 270 332   Cardiac Enzymes:  Lab 03/17/12 1121  CKTOTAL --  CKMB --  CKMBINDEX --  TROPONINI <0.30   CBG:  Lab 03/21/12 0744 03/20/12 2151 03/20/12 1653 03/20/12 1150 03/20/12 0749  GLUCAP 282* 255* 252* 261* 306*    Time coordinating discharge: Over 30 minutes  Signed:  Manson Passey, MD  [facility] 03/21/2012, 3:14  PM  Pager #: [phone]

## 2012-03-21 NOTE — Progress Notes (Signed)
Patient requests a stool softener.  Thank you.

## 2012-03-21 NOTE — Progress Notes (Signed)
Patient discharged home with verbal order to inject 50 units NPH before breakfast, and 40 units NPH prior to dinner.  Verbal order received from MD to discontinue sliding scale Humalog mix that was included on the After-Visit Summary.  MD stated that the appropriate dosing had been discussed with the patient, and that he understood.  MD also advised the RN to emphasize followup with his primary care MD for adjustment to insulin regimen.

## 2012-03-21 NOTE — Progress Notes (Signed)
Inpatient Diabetes Program Recommendations  AACE/ADA: New Consensus Statement on Inpatient Glycemic Control (2009)  Target Ranges:  Prepandial:   less than 140 mg/dL      Peak postprandial:   less than 180 mg/dL (1-2 hours)      Critically ill patients:  140 - 180 mg/dL   Reason for Visit: Hyperglycemia  RN paged to see pt regarding his hyperglycemia.  He has requested to be put on his previous home dose of NPH 50 in am and NPH 40 in pm instead of Lantus.  Wants to go home, states he feels good and slept well after requesting something for sleep.  States he doesn't understand why MD would tell him that we want his blood sugars to be 200 - 250.  Text paged Dr. Elisabeth Pigeon.  Discussed above with Sharman Crate, RN. Pt has been instructed to f/u with PCP next week.  Results for DOYE, MONTILLA (MRN [id number]) as of 03/21/2012 10:55  Ref. Range 03/20/2012 07:49 03/20/2012 11:50 03/20/2012 16:53 03/20/2012 21:51 03/21/2012 07:44  Glucose-Capillary Latest Range: 70-99 mg/dL 409 (H) 811 (H) 914 (H) 255 (H) 282 (H)      Inpatient Diabetes Program Recommendations Insulin - Basal: D/C Lantus -NPH 40 units ac Dinner today and increase to NPH 50 units in am and NPH 40 units ac Dinner (previous home dose) Correction (SSI): Increase Novolog to resistant tidwc and hs Insulin - Meal Coverage: Increase meal coverage insulin Novolog to 10 units ac Breakfast and 10 units ac Dinner (previous home dose)  Note: Text paged Dr. Ellin Goodie.

## 2012-03-24 LAB — GLUCOSE, CAPILLARY

## 2012-04-30 ENCOUNTER — Ambulatory Visit: Payer: BC Managed Care – PPO | Admitting: Cardiology

## 2012-06-17 ENCOUNTER — Encounter: Payer: Self-pay | Admitting: Cardiology

## 2012-06-17 DIAGNOSIS — I152 Hypertension secondary to endocrine disorders: Secondary | ICD-10-CM | POA: Insufficient documentation

## 2012-06-17 DIAGNOSIS — E1159 Type 2 diabetes mellitus with other circulatory complications: Secondary | ICD-10-CM | POA: Insufficient documentation

## 2012-06-17 DIAGNOSIS — I2510 Atherosclerotic heart disease of native coronary artery without angina pectoris: Secondary | ICD-10-CM | POA: Insufficient documentation

## 2012-06-17 DIAGNOSIS — IMO0002 Reserved for concepts with insufficient information to code with codable children: Secondary | ICD-10-CM | POA: Insufficient documentation

## 2012-06-17 DIAGNOSIS — E039 Hypothyroidism, unspecified: Secondary | ICD-10-CM | POA: Insufficient documentation

## 2012-06-17 DIAGNOSIS — I10 Essential (primary) hypertension: Secondary | ICD-10-CM

## 2012-06-17 DIAGNOSIS — R9430 Abnormal result of cardiovascular function study, unspecified: Secondary | ICD-10-CM | POA: Insufficient documentation

## 2012-06-20 ENCOUNTER — Encounter: Payer: Self-pay | Admitting: *Deleted

## 2012-06-20 ENCOUNTER — Ambulatory Visit (INDEPENDENT_AMBULATORY_CARE_PROVIDER_SITE_OTHER): Payer: BC Managed Care – PPO | Admitting: Cardiology

## 2012-06-20 ENCOUNTER — Encounter: Payer: Self-pay | Admitting: Cardiology

## 2012-06-20 VITALS — BP 111/64 | HR 76 | Ht 71.0 in | Wt 239.0 lb

## 2012-06-20 DIAGNOSIS — E113293 Type 2 diabetes mellitus with mild nonproliferative diabetic retinopathy without macular edema, bilateral: Secondary | ICD-10-CM | POA: Insufficient documentation

## 2012-06-20 DIAGNOSIS — E785 Hyperlipidemia, unspecified: Secondary | ICD-10-CM

## 2012-06-20 DIAGNOSIS — E119 Type 2 diabetes mellitus without complications: Secondary | ICD-10-CM

## 2012-06-20 DIAGNOSIS — I10 Essential (primary) hypertension: Secondary | ICD-10-CM

## 2012-06-20 DIAGNOSIS — E039 Hypothyroidism, unspecified: Secondary | ICD-10-CM

## 2012-06-20 DIAGNOSIS — I2510 Atherosclerotic heart disease of native coronary artery without angina pectoris: Secondary | ICD-10-CM

## 2012-06-20 NOTE — Assessment & Plan Note (Signed)
His thyroid is being treated. No change in therapy.

## 2012-06-20 NOTE — Assessment & Plan Note (Signed)
The patient has known coronary disease. He had nonobstructive disease in 2003. He is active. He has not had an exercise test since 2003. It is appropriate to proceed with a stress nuclear scan at this time. He has documented coronary disease.

## 2012-06-20 NOTE — Assessment & Plan Note (Signed)
Blood pressure is under good control. No change in therapy 

## 2012-06-20 NOTE — Assessment & Plan Note (Signed)
Patient's diabetes is under control. He was hospitalized in July, 2013. He is doing well. No further workup.

## 2012-06-20 NOTE — Patient Instructions (Addendum)
Your physician recommends that you schedule a follow-up appointment in: 1 year. You will receive a reminder letter in the mail in about 10 months reminding you to call and schedule your appointment. If you don't receive this letter, please contact our office. Your physician recommends that you continue on your current medications as directed. Please refer to the Current Medication list given to you today. Your physician has requested that you have en exercise stress myoview. For further information please visit [email or web address]. Please follow instruction sheet, as given.  

## 2012-06-20 NOTE — Progress Notes (Signed)
Patient ID: Joseph Kirby, male   DOB: 01/20/1956, 56 y.o.   MRN: [id number]   HPI  Patient is seen today to followup coronary disease. He has actually done well. I saw him last July, 2011. He underwent catheterization in 2003. He did have some nonobstructive coronary disease. He has done well. He has had excellent secondary prevention. He is fully active. He's not having any significant chest pain at this time. He does have diabetes area he also has hypothyroidism. These are all treated. He did have a period in July, 2013 with hyperglycemia. This occurred with medication changes. He did have to be hospitalized. He stabilize and these done well since then. As part of today's evaluation I have reviewed his old records. I have completely updated this new electronic medical record.  Allergies  Allergen Reactions  . Ezetimibe-Simvastatin   . Iodine Swelling  . Shrimp (Shellfish Allergy)     Current Outpatient Prescriptions  Medication Sig Dispense Refill  . aspirin 81 MG tablet Take 81 mg by mouth daily.        Marland Kitchen atorvastatin (LIPITOR) 80 MG tablet Take 80 mg by mouth daily.       Marland Kitchen bismuth subsalicylate (PEPTO BISMOL) 262 MG/15ML suspension Take 15 mLs by mouth every 6 (six) hours as needed. For upset stomach       . Cholecalciferol (VITAMIN D) 2000 UNITS CAPS Take 2 capsules by mouth daily.      . insulin NPH (HUMULIN N,NOVOLIN N) 100 UNIT/ML injection Inject 40-50 Units into the skin 2 (two) times daily before a meal.      . insulin regular (NOVOLIN R,HUMULIN R) 100 units/mL injection Inject 10 Units into the skin 2 (two) times daily before a meal.       . levothyroxine (SYNTHROID, LEVOTHROID) 175 MCG tablet Take 175 mcg by mouth daily.        . naproxen sodium (ANAPROX) 220 MG tablet Take 220 mg by mouth 2 (two) times daily with a meal.      . niacin (NIASPAN) 500 MG CR tablet Take 1,500 mg by mouth at bedtime. Take 3 tabs at night      . omega-3 acid ethyl esters (LOVAZA) 1 G capsule Take 1 g  by mouth daily.        . ramipril (ALTACE) 5 MG capsule Take 5 mg by mouth daily.          History   Social History  . Marital Status: Married    Spouse Name: N/A    Number of Children: N/A  . Years of Education: N/A   Occupational History  . Not on file.   Social History Main Topics  . Smoking status: Former Smoker    Types: Cigarettes    Quit date: 03/17/1993  . Smokeless tobacco: Never Used  . Alcohol Use: No  . Drug Use: No  . Sexually Active: No   Other Topics Concern  . Not on file   Social History Narrative  . No narrative on file    No family history on file.  Past Medical History  Diagnosis Date  . BPH (benign prostatic hyperplasia)   . Colon polyp   . CAD (coronary artery disease)     Nonobstructive disease by catheterization, 2003  . Hypertension   . Hyperlipidemia   . Diabetes mellitus   . Hypothyroidism   . Ejection fraction     EF 55%, catheterization, 2003    Past Surgical History  Procedure Date  .  Cardiac catheterization 2003    NONOBSTRUCTIVE, EF 55%    Patient Active Problem List  Diagnosis  . DYSLIPIDEMIA  . BPH (benign prostatic hyperplasia)  . Colon polyp  . DKA (diabetic ketoacidoses)  . CAD (coronary artery disease)  . Hypertension  . Hypothyroidism  . Ejection fraction    ROS   Patient denies fever, chills, headache, sweats, rash, change in vision, change in hearing, chest pain, cough, nausea vomiting, urinary symptoms. All other systems are reviewed and are negative.  PHYSICAL EXAM   Patient is oriented to person time and place. Affect is normal. Lungs are clear. Respiratory effort is nonlabored. Cardiac exam reveals S1 and S2. There no clicks or significant murmurs. The abdomen is soft. Is no peripheral edema. There are no musculoskeletal deformities. There are no skin rashes.  Filed Vitals:   06/20/12 1332  BP: 111/64  Pulse: 76  Height: 5\' 11"  (1.803 m)  Weight: 239 lb (108.41 kg)   Patient did not have an EKG  today. He had an EKG while hospitalized in July, 2013. I have reviewed that tracing. There is normal sinus rhythm. There were no diagnostic abnormalities.  ASSESSMENT & PLAN

## 2012-06-20 NOTE — Assessment & Plan Note (Signed)
The patient's lipids are being treated well by his primary team. No further workup.

## 2012-06-30 ENCOUNTER — Ambulatory Visit (HOSPITAL_COMMUNITY): Payer: BC Managed Care – PPO | Attending: Internal Medicine | Admitting: Radiology

## 2012-06-30 VITALS — BP 101/56 | Ht 71.0 in | Wt 235.0 lb

## 2012-06-30 DIAGNOSIS — R5381 Other malaise: Secondary | ICD-10-CM | POA: Insufficient documentation

## 2012-06-30 DIAGNOSIS — I2510 Atherosclerotic heart disease of native coronary artery without angina pectoris: Secondary | ICD-10-CM | POA: Insufficient documentation

## 2012-06-30 DIAGNOSIS — R42 Dizziness and giddiness: Secondary | ICD-10-CM | POA: Insufficient documentation

## 2012-06-30 DIAGNOSIS — E119 Type 2 diabetes mellitus without complications: Secondary | ICD-10-CM

## 2012-06-30 DIAGNOSIS — I10 Essential (primary) hypertension: Secondary | ICD-10-CM | POA: Insufficient documentation

## 2012-06-30 DIAGNOSIS — R5383 Other fatigue: Secondary | ICD-10-CM | POA: Insufficient documentation

## 2012-06-30 MED ORDER — TECHNETIUM TC 99M SESTAMIBI GENERIC - CARDIOLITE
10.0000 | Freq: Once | INTRAVENOUS | Status: AC | PRN
  Administered 2012-06-30: 10 via INTRAVENOUS

## 2012-06-30 MED ORDER — TECHNETIUM TC 99M SESTAMIBI GENERIC - CARDIOLITE
30.0000 | Freq: Once | INTRAVENOUS | Status: AC | PRN
  Administered 2012-06-30: 30 via INTRAVENOUS

## 2012-06-30 NOTE — Progress Notes (Signed)
[facility] SITE 3 NUCLEAR MED [address] [id number] [place] [address] [phone]  Cardiology Nuclear Med Study  Joseph Kirby is a 56 y.o. male     MRN : [id number]     DOB: [birth year]  Procedure Date: 06/30/2012  Nuclear Med Background Indication for Stress Test:  Evaluation for Ischemia History:  '03 Heart Cath: N/O CAD EF: 55%, '07 GXT Cardiac Risk Factors: History of Smoking, Hypertension, IDDM Type 2 and Lipids  Symptoms:  Fatigue and Light-Headedness   Nuclear Pre-Procedure Caffeine/Decaff Intake:  None > 12 hrs NPO After: 9:00pm   Lungs:  clear O2 Sat: 96% on room air. IV 0.9% NS with Angio Cath:  22g  IV Site: R Antecubital x 1, tolerated well IV Started by:  Irean Hong, RN  Chest Size (in):  48 Cup Size: n/a  Height: 5\' 11"  (1.803 m)  Weight:  235 lb (106.595 kg)  BMI:  Body mass index is 32.78 kg/(m^2). Tech Comments:  FBS was 300 at 7:30am, water given, no insulin today.    Nuclear Med Study 1 or 2 day study: 1 day  Stress Test Type:  Stress  Reading MD: Dietrich Pates, MD  Order Authorizing Provider:  Willa Rough, MD  Resting Radionuclide: Technetium 63m Sestamibi  Resting Radionuclide Dose: 11.0 mCi   Stress Radionuclide:  Technetium 68m Sestamibi  Stress Radionuclide Dose: 33.0 mCi           Stress Protocol Rest HR: 61 Stress HR: 146  Rest BP: 101/56 Stress BP: 185  Exercise Time (min): 6:06 METS: 7.10   Predicted Max HR: 165 bpm % Max HR: 88.48 bpm Rate Pressure Product: 44010   Dose of Adenosine (mg):  n/a Dose of Lexiscan: n/a mg  Dose of Atropine (mg): n/a Dose of Dobutamine: n/a mcg/kg/min (at max HR)  Stress Test Technologist: Milana Na, EMT-P  Nuclear Technologist:  Domenic Polite, CNMT     Rest Procedure:  Myocardial perfusion imaging was performed at rest 45 minutes following the intravenous administration of Technetium 55m Sestamibi. Rest ECG: NSR - Normal EKG  Stress Procedure:  The patient  performed treadmill exercise using a Bruce  Protocol for 6:06 minutes. The patient stopped due to fatigue and denied any chest pain.  There were no significant ST-T wave changes and a rare pvc.  Technetium 70m Sestamibi was injected at peak exercise and myocardial perfusion imaging was performed after a brief delay. Stress ECG: No significant change from baseline ECG  QPS Raw Data Images:  Soft tissue (diaprhagm, bowel activity) underlie heart. Stress Images:  Normal homogeneous uptake in all areas of the myocardium. Rest Images:  Normal homogeneous uptake in all areas of the myocardium. Subtraction (SDS):  No evidence of ischemia. Transient Ischemic Dilatation (Normal <1.22):  1.02 Lung/Heart Ratio (Normal <0.45):  0.31  Quantitative Gated Spect Images QGS EDV:  92 ml QGS ESV:  30 ml  Impression Exercise Capacity:  Fair exercise capacity. BP Response:  Normal blood pressure response. Clinical Symptoms:  No chest pain. ECG Impression:  No significant ST segment change suggestive of ischemia. Comparison with Prior Nuclear Study:  No prior study.  Overall Impression:  Normal stress nuclear study.  LV Ejection Fraction: 68%.  LV Wall Motion:  NL LV Function; NL Wall Motion  Dietrich Pates

## 2012-07-08 ENCOUNTER — Encounter: Payer: Self-pay | Admitting: Cardiology

## 2012-07-14 ENCOUNTER — Telehealth: Payer: Self-pay | Admitting: Cardiology

## 2012-07-14 NOTE — Telephone Encounter (Signed)
Patient informed via message machine that report states normal nuclear stress study and EF normal.

## 2012-07-14 NOTE — Telephone Encounter (Signed)
PATIENT INQUIRING ABOUT TEST RESULTS

## 2012-12-01 ENCOUNTER — Other Ambulatory Visit: Payer: Self-pay | Admitting: *Deleted

## 2012-12-01 MED ORDER — NIACIN ER (ANTIHYPERLIPIDEMIC) 500 MG PO TBCR: 1500.0000 mg | EXTENDED_RELEASE_TABLET | Freq: Every day | ORAL | Status: DC

## 2012-12-25 ENCOUNTER — Other Ambulatory Visit (INDEPENDENT_AMBULATORY_CARE_PROVIDER_SITE_OTHER): Payer: PRIVATE HEALTH INSURANCE

## 2012-12-25 DIAGNOSIS — E785 Hyperlipidemia, unspecified: Secondary | ICD-10-CM

## 2012-12-25 DIAGNOSIS — E119 Type 2 diabetes mellitus without complications: Secondary | ICD-10-CM

## 2012-12-25 DIAGNOSIS — R5381 Other malaise: Secondary | ICD-10-CM

## 2012-12-25 DIAGNOSIS — I10 Essential (primary) hypertension: Secondary | ICD-10-CM

## 2012-12-25 DIAGNOSIS — Z125 Encounter for screening for malignant neoplasm of prostate: Secondary | ICD-10-CM

## 2012-12-25 LAB — POCT UA - MICROSCOPIC ONLY
Bacteria, U Microscopic: NEGATIVE
Casts, Ur, LPF, POC: NEGATIVE
Mucus, UA: NEGATIVE

## 2012-12-25 LAB — HEPATIC FUNCTION PANEL
ALT: 28 U/L (ref 0–53)
Alkaline Phosphatase: 92 U/L (ref 39–117)
Indirect Bilirubin: 0.5 mg/dL (ref 0.0–0.9)
Total Protein: 6 g/dL (ref 6.0–8.3)

## 2012-12-25 LAB — BASIC METABOLIC PANEL
CO2: 29 mEq/L (ref 19–32)
Chloride: 103 mEq/L (ref 96–112)
Glucose, Bld: 203 mg/dL — ABNORMAL HIGH (ref 70–99)
Sodium: 138 mEq/L (ref 135–145)

## 2012-12-25 LAB — POCT CBC
HCT, POC: 44.3 % (ref 43.5–53.7)
Hemoglobin: 15.3 g/dL (ref 14.1–18.1)
MCH, POC: 28.9 pg (ref 27–31.2)
MPV: 7.6 fL (ref 0–99.8)
POC Granulocyte: 3.3 (ref 2–6.9)
POC LYMPH PERCENT: 27.7 %L (ref 10–50)
RBC: 5.3 M/uL (ref 4.69–6.13)

## 2012-12-25 LAB — POCT UA - MICROALBUMIN

## 2012-12-25 LAB — POCT URINALYSIS DIPSTICK
Bilirubin, UA: NEGATIVE
Blood, UA: NEGATIVE
Ketones, UA: NEGATIVE
Leukocytes, UA: NEGATIVE
Spec Grav, UA: <=1.005
pH, UA: 7.5

## 2012-12-26 LAB — NMR LIPOPROFILE WITH LIPIDS
HDL Particle Number: 28.7 umol/L — ABNORMAL LOW (ref >=30.5)
LDL Size: 20.5 nm — ABNORMAL LOW (ref >20.5)
Large HDL-P: 6 umol/L (ref >=4.8)
Large VLDL-P: 1 nmol/L (ref <=2.7)
Small LDL Particle Number: 292 nmol/L (ref <=527)

## 2012-12-31 ENCOUNTER — Other Ambulatory Visit: Payer: Self-pay | Admitting: Family Medicine

## 2012-12-31 NOTE — Telephone Encounter (Signed)
LAST LABS 7/13

## 2013-01-23 ENCOUNTER — Other Ambulatory Visit: Payer: Self-pay | Admitting: Family Medicine

## 2013-02-04 ENCOUNTER — Encounter: Payer: Self-pay | Admitting: Family Medicine

## 2013-02-04 ENCOUNTER — Ambulatory Visit (INDEPENDENT_AMBULATORY_CARE_PROVIDER_SITE_OTHER): Payer: PRIVATE HEALTH INSURANCE | Admitting: Family Medicine

## 2013-02-04 VITALS — BP 106/55 | HR 72 | Temp 98.4°F | Ht 70.5 in | Wt 239.8 lb

## 2013-02-04 DIAGNOSIS — E119 Type 2 diabetes mellitus without complications: Secondary | ICD-10-CM

## 2013-02-04 DIAGNOSIS — E785 Hyperlipidemia, unspecified: Secondary | ICD-10-CM

## 2013-02-04 DIAGNOSIS — Z23 Encounter for immunization: Secondary | ICD-10-CM

## 2013-02-04 DIAGNOSIS — I2510 Atherosclerotic heart disease of native coronary artery without angina pectoris: Secondary | ICD-10-CM

## 2013-02-04 DIAGNOSIS — I10 Essential (primary) hypertension: Secondary | ICD-10-CM

## 2013-02-04 DIAGNOSIS — E039 Hypothyroidism, unspecified: Secondary | ICD-10-CM

## 2013-02-04 MED ORDER — ZOSTER VACCINE LIVE 19400 UNT/0.65ML ~~LOC~~ SOLR: 0.6500 mL | Freq: Once | SUBCUTANEOUS | Status: DC

## 2013-02-04 NOTE — Addendum Note (Signed)
Addended by: Bearl Mulberry on: 02/04/2013 05:24 PM   Modules accepted: Orders, Medications

## 2013-02-04 NOTE — Patient Instructions (Addendum)
Continue current meds and therapeutic lifestyle changes 

## 2013-02-04 NOTE — Progress Notes (Signed)
  Subjective:    Patient ID: Joseph Kirby, male    DOB: May 16, 1956, 57 y.o.   MRN: [id number]  HPI [name] in today for followup of his chronic medical problems. He sees Dr.Petit in [facility]. Patient received Pneumovax 02/09/2011. His insurance co-pay for Zostavax so he will get that today. He also noted that his dad passed away in Nov 01, 2022 with congestive heart failure he was 5 years old. His mom is still living at 51 and just has some aging issues.   Review of Systems  Constitutional: Positive for fatigue.  HENT: Negative.   Eyes: Positive for discharge (watery, occasional) and itching (due to allergies). Negative for pain and redness.  Respiratory: Negative.   Cardiovascular: Negative.   Gastrointestinal: Negative.   Endocrine: Negative.   Genitourinary: Negative.   Musculoskeletal: Positive for joint swelling (L knee with pain ).  Allergic/Immunologic: Positive for environmental allergies (seasonal).  Neurological: Negative.   Psychiatric/Behavioral: Negative.        Objective:   Physical Exam BP 106/55  Pulse 72  Temp(Src) 98.4 F (36.9 C) (Oral)  Ht 5' 10.5" (1.791 m)  Wt 239 lb 12.8 oz (108.773 kg)  BMI 33.91 kg/m2  The patient appeared well nourished and normally developed, alert and oriented to time and place. Speech, behavior and judgement appear normal. Vital signs as documented.  Head exam is unremarkable. No scleral icterus or pallor noted. Nose and throat are normal.  Neck is without jugular venous distension, thyromegally, or carotid bruits. Carotid upstrokes are brisk bilaterally. No cervical adenopathy. Lungs are clear anteriorly and posteriorly to auscultation. Normal respiratory effort. Cardiac exam reveals regular rate and rhythm at 72 per minute. First and second heart sounds normal.  No murmurs, rubs or gallops.  Abdominal exam reveals normal bowl sounds, no masses, no organomegaly and no aortic enlargement. No inguinal  adenopathy. Extremities are nonedematous and both femoral and pedal pulses are normal. Skin without pallor or jaundice.  Warm and dry, without rash. Neurologic exam reveals normal deep tendon reflexes and normal sensation.           Assessment & Plan:  1. Diabetes, insulin-dependent  2. Hypertension  3. Hypothyroidism  4. DYSLIPIDEMIA  5. CAD (coronary artery disease)  Patient Instructions  Continue current meds and therapeutic lifestyle changes   Lab orders for that and for future visit Prostate exam due next 11-01-2022

## 2013-04-06 ENCOUNTER — Other Ambulatory Visit: Payer: Self-pay | Admitting: Family Medicine

## 2013-04-16 ENCOUNTER — Telehealth: Payer: Self-pay | Admitting: Family Medicine

## 2013-04-16 ENCOUNTER — Other Ambulatory Visit: Payer: Self-pay | Admitting: *Deleted

## 2013-04-16 NOTE — Telephone Encounter (Signed)
LMOM

## 2013-04-20 ENCOUNTER — Telehealth: Payer: Self-pay | Admitting: Family Medicine

## 2013-04-21 NOTE — Telephone Encounter (Signed)
Pt notified it is to help lower cholesterol numbers

## 2013-05-11 ENCOUNTER — Other Ambulatory Visit: Payer: Self-pay | Admitting: Family Medicine

## 2013-06-03 ENCOUNTER — Other Ambulatory Visit: Payer: Self-pay | Admitting: Nurse Practitioner

## 2013-06-08 ENCOUNTER — Ambulatory Visit (INDEPENDENT_AMBULATORY_CARE_PROVIDER_SITE_OTHER): Payer: BC Managed Care – PPO | Admitting: Family Medicine

## 2013-06-08 ENCOUNTER — Encounter: Payer: Self-pay | Admitting: Family Medicine

## 2013-06-08 VITALS — BP 110/70 | HR 62 | Temp 98.0°F | Ht 70.5 in | Wt 238.0 lb

## 2013-06-08 DIAGNOSIS — I10 Essential (primary) hypertension: Secondary | ICD-10-CM

## 2013-06-08 DIAGNOSIS — E039 Hypothyroidism, unspecified: Secondary | ICD-10-CM

## 2013-06-08 DIAGNOSIS — N400 Enlarged prostate without lower urinary tract symptoms: Secondary | ICD-10-CM

## 2013-06-08 DIAGNOSIS — E559 Vitamin D deficiency, unspecified: Secondary | ICD-10-CM

## 2013-06-08 DIAGNOSIS — I2510 Atherosclerotic heart disease of native coronary artery without angina pectoris: Secondary | ICD-10-CM

## 2013-06-08 DIAGNOSIS — J309 Allergic rhinitis, unspecified: Secondary | ICD-10-CM

## 2013-06-08 DIAGNOSIS — E119 Type 2 diabetes mellitus without complications: Secondary | ICD-10-CM

## 2013-06-08 DIAGNOSIS — E785 Hyperlipidemia, unspecified: Secondary | ICD-10-CM

## 2013-06-08 NOTE — Progress Notes (Signed)
Subjective:    Patient ID: Joseph Kirby, male    DOB: 1955-12-07, 57 y.o.   MRN: [id number]  HPI Pt here for follow up and management of chronic medical problems. The patient indicated today that he had been laid off of his job permanently. He is somewhat remorseful about this but is hopeful that he will find another job so that he can stay active physically. He does have some complaints with allergic rhinitis.   Patient Active Problem List   Diagnosis Date Noted  . Diabetes, insulin-dependent 06/20/2012  . CAD (coronary artery disease)   . Hypertension   . Hypothyroidism   . Ejection fraction   . DKA (diabetic ketoacidoses), history of 03/17/2012  . BPH (benign prostatic hyperplasia)   . Colon polyp   . DYSLIPIDEMIA 03/27/2010   Outpatient Encounter Prescriptions as of 06/08/2013  Medication Sig Dispense Refill  . aspirin 81 MG tablet Take 81 mg by mouth daily.        Marland Kitchen atorvastatin (LIPITOR) 80 MG tablet Take 80 mg by mouth daily.       Marland Kitchen bismuth subsalicylate (PEPTO BISMOL) 262 MG/15ML suspension Take 15 mLs by mouth every 6 (six) hours as needed. For upset stomach       . Cholecalciferol (VITAMIN D) 2000 UNITS CAPS Take 2 capsules by mouth daily.      . insulin regular (HUMULIN R) 100 units/mL injection       . levothyroxine (SYNTHROID, LEVOTHROID) 175 MCG tablet TAKE 1 TABLET EVERY DAY TIME FOR LABS  90 tablet  0  . LOVAZA 1 G capsule TAKE TWO CAPSULES BY MOUTH TWICE DAILY  120 capsule  1  . niacin (NIASPAN) 500 MG CR tablet TAKE 3 TABLETS (1,500 MG TOTAL) BY MOUTH AT BEDTIME.  90 tablet  5  . nystatin-triamcinolone (MYCOLOG II) cream       . ONE TOUCH ULTRA TEST test strip       . ramipril (ALTACE) 5 MG capsule TAKE 1 CAPSULE BY MOUTH ONCE A DAY  90 capsule  0  . [DISCONTINUED] HUMULIN R 100 UNIT/ML injection USE 4 UNITS EVERY MORNINGAND 4 UNITS EVERY EVENING  30 mL  5  . [DISCONTINUED] naproxen sodium (ANAPROX) 220 MG tablet Take 220 mg by mouth 2 (two) times daily with a  meal.       No facility-administered encounter medications on file as of 06/08/2013.       Review of Systems  Constitutional: Negative.   HENT: Positive for sneezing and postnasal drip.   Eyes: Negative.   Respiratory: Negative.   Cardiovascular: Negative.   Gastrointestinal: Negative.   Endocrine: Negative.   Genitourinary: Negative.   Musculoskeletal: Negative.   Skin: Negative.   Allergic/Immunologic: Negative.   Neurological: Negative.   Hematological: Negative.   Psychiatric/Behavioral: Negative.        Objective:   Physical Exam  Nursing note and vitals reviewed. Constitutional: He is oriented to person, place, and time. He appears well-developed and well-nourished.  HENT:  Head: Normocephalic and atraumatic.  Right Ear: External ear normal.  Left Ear: External ear normal.  Mouth/Throat: Oropharynx is clear and moist. No oropharyngeal exudate.  Nasal congestion bilaterally  Eyes: Conjunctivae and EOM are normal. Pupils are equal, round, and reactive to light. Right eye exhibits no discharge. Left eye exhibits no discharge. No scleral icterus.  Neck: Normal range of motion. Neck supple. No thyromegaly present.  Cardiovascular: Normal rate, regular rhythm, normal heart sounds and intact distal pulses.  Exam  reveals no gallop and no friction rub.   No murmur heard. At 72 per minute  Pulmonary/Chest: Effort normal and breath sounds normal. He has no wheezes. He has no rales.  Abdominal: Soft. Bowel sounds are normal. He exhibits no mass. There is no tenderness. There is no rebound and no guarding.  Somewhat obese  Musculoskeletal: Normal range of motion. He exhibits no edema and no tenderness.  Lymphadenopathy:    He has no cervical adenopathy.  Neurological: He is alert and oriented to person, place, and time. He has normal reflexes. No cranial nerve deficit.  Skin: Skin is warm and dry. No rash noted. No erythema. No pallor.  Psychiatric: He has a normal mood and  affect. His behavior is normal. Judgment and thought content normal.   BP 110/70  Pulse 62  Temp(Src) 98 F (36.7 C) (Oral)  Ht 5' 10.5" (1.791 m)  Wt 238 lb (107.956 kg)  BMI 33.66 kg/m2  Diabetic foot exam was done      Assessment & Plan:   1. Hypertension   2. DYSLIPIDEMIA   3. BPH (benign prostatic hyperplasia)   4. CAD (coronary artery disease)   5. Hypothyroidism   6. Diabetes, insulin-dependent   7. Vitamin D deficiency   8. Allergic rhinitis    Orders Placed This Encounter  Procedures  . Hepatic function panel    Standing Status: Future     Number of Occurrences:      Standing Expiration Date: 06/08/2014  . BMP8+EGFR    Standing Status: Future     Number of Occurrences:      Standing Expiration Date: 06/08/2014  . NMR, lipoprofile    Standing Status: Future     Number of Occurrences:      Standing Expiration Date: 06/08/2014  . Vit D  25 hydroxy (rtn osteoporosis monitoring)    Standing Status: Future     Number of Occurrences:      Standing Expiration Date: 06/08/2014  . POCT CBC    Standing Status: Future     Number of Occurrences:      Standing Expiration Date: 08/08/2013   Meds ordered this encounter  Medications  . insulin regular (HUMULIN R) 100 units/mL injection    Sig:    Patient Instructions  Always be careful and don't put yourself at risk for falling Continue to take medications Try to get plenty of exercise Stick to your diet as closely as possible Do not forget to get your flu shot and consider getting a Prevnar shot which is additional protection for pneumonia Try Nasacort over-the-counter 1-2 sprays each nostril daily   Nyra Capes MD

## 2013-06-08 NOTE — Patient Instructions (Addendum)
Always be careful and don't put yourself at risk for falling Continue to take medications Try to get plenty of exercise Stick to your diet as closely as possible Do not forget to get your flu shot and consider getting a Prevnar shot which is additional protection for pneumonia Try Nasacort over-the-counter 1-2 sprays each nostril daily

## 2013-07-13 ENCOUNTER — Other Ambulatory Visit: Payer: Self-pay | Admitting: Family Medicine

## 2013-07-31 ENCOUNTER — Ambulatory Visit (INDEPENDENT_AMBULATORY_CARE_PROVIDER_SITE_OTHER): Payer: BC Managed Care – PPO | Admitting: *Deleted

## 2013-07-31 DIAGNOSIS — Z23 Encounter for immunization: Secondary | ICD-10-CM

## 2013-08-10 ENCOUNTER — Ambulatory Visit (INDEPENDENT_AMBULATORY_CARE_PROVIDER_SITE_OTHER): Payer: BC Managed Care – PPO | Admitting: Family Medicine

## 2013-08-10 ENCOUNTER — Encounter: Payer: Self-pay | Admitting: Family Medicine

## 2013-08-10 VITALS — BP 106/60 | HR 66 | Temp 98.7°F | Ht 70.5 in | Wt 240.0 lb

## 2013-08-10 DIAGNOSIS — M7520 Bicipital tendinitis, unspecified shoulder: Secondary | ICD-10-CM

## 2013-08-10 DIAGNOSIS — M7521 Bicipital tendinitis, right shoulder: Secondary | ICD-10-CM

## 2013-08-10 MED ORDER — NAPROXEN 500 MG PO TABS: 500.0000 mg | ORAL_TABLET | Freq: Two times a day (BID) | ORAL | Status: DC

## 2013-08-10 NOTE — Progress Notes (Signed)
   Subjective:    Patient ID: Joseph Kirby, male    DOB: 17-Jul-1956, 57 y.o.   MRN: [id number]  HPI This 57 y.o. male presents for evaluation of right arm pain.  He has had this for 3 weeks And does not recall injury of why he could have this.   Review of Systems C/o right arm pain. No chest pain, SOB, HA, dizziness, vision change, N/V, diarrhea, constipation, dysuria, urinary urgency or frequency or rash.     Objective:   Physical Exam Vital signs noted  Well developed well nourished male.  HEENT - Head atraumatic Normocephalic Respiratory - Lungs CTA bilateral Cardiac - RRR S1 and S2 without murmur GI - Abdomen soft Nontender and bowel sounds active x 4 Extremities - No edema. Neuro - Grossly intact. MS - Right bicep tendonitis      Assessment & Plan:  Biceps tendonitis, right - Plan: naproxen (NAPROSYN) 500 MG tablet Po bid x 10 days.  Discussed taking zantac bid to prevent GERD sx's. Follow up prn.  [name] FNP

## 2013-08-10 NOTE — Patient Instructions (Signed)
Bicipital Tendonitis  Bicipital tendonitis refers to redness, soreness, and swelling (inflammation) or irritation of the bicep tendon. The biceps muscle is located between the elbow and shoulder of the inner arm. The tendon heads, similar to pieces of rope, connect the bicep muscle to the shoulder socket. They are called short head and long head tendons. When tendonitis occurs, the long head tendon is inflamed and swollen, and may be thickened or partially torn.   Bicipital tendonitis can occur with other problems as well, such as arthritis in the shoulder or acromioclavicular joints, tears in the tendons, or other rotator cuff problems.   CAUSES   Overuse of of the arms for overhead activities is the major cause of tendonitis. Many athletes, such as swimmers, baseball players, and tennis players are prone to bicipital tendonitis. Jobs that require manual labor or routine chores, especially chores involving overhead activities can result in overuse and tendonitis.  SYMPTOMS  Symptoms may include:  · Pain in and around the front of the shoulder. Pain may be worse with overhead motion.  · Pain or aching that radiates down the arm.  · Clicking or shifting sensations in the shoulder.  DIAGNOSIS  Your caregiver may perform the following:  · Physical exam and tests of the biceps and shoulder to observe range of motion, strength, and stability.  · X-rays or magnetic resonance imaging (MRI) to confirm the diagnosis. In most common cases, these tests are not necessary.  Since other problems may exist in the shoulder or rotator cuff, additional tests may be recommended.  TREATMENT  Treatment may include the following:  · Medications  · Your caregiver may prescribe over-the-counter pain relievers.  · Steroid injections, such as cortisone, may be recommended. These may help to reduce inflammation and pain.  · Physical Therapy - Your caregiver may recommend gentle exercises with the arm. These can help restore strength and range  of motion. They may be done at home or with a physical therapist's supervision and input.  · Surgery - Arthroscopic or open surgery sometimes is necessary. Surgery may include:  · Reattachment or repair of the tendon at the shoulder socket.  · Removal of the damaged section of the tendon.  · Anchoring the tendon to a different area of the shoulder (tenodesis).  HOME CARE INSTRUCTIONS   · Avoid overhead motion of the affected arm or any other motion that causes pain.  · Take medication for pain as directed. Do not take these for more than 3 weeks, unless directed to do so by your caregiver.  · Ice the affected area for 20 minutes at a time, 3-4 times per day. Place a towel on the skin over the painful area and the ice or cold pack over the towel. Do not place ice directly on the skin.  · Perform gentle exercises at home as directed. These will increase strength and flexibility.  PREVENTION  · Modify your activities as much as possible to protect your arm. A physical therapist or sports medicine physician can help you understand options for safe motion.  · Avoid repetitive overhead pulling, lifting, reaching, and throwing until your caregiver tells you it is ok to resume these activities.  SEEK MEDICAL CARE IF:  · Your pain worsens.  · You have difficulty moving the affected arm.  · You have trouble performing any of the self-care instructions.  MAKE SURE YOU:   · Understand these instructions.  · Will watch your condition.  · Will get help right away   if you are not doing well or get worse.  Document Released: 09/29/2010 Document Revised: 11/19/2011 Document Reviewed: 09/29/2010  [employer]® Patient Information ©2014 [employer].

## 2013-08-11 ENCOUNTER — Other Ambulatory Visit: Payer: Self-pay

## 2013-08-11 ENCOUNTER — Telehealth: Payer: Self-pay | Admitting: *Deleted

## 2013-08-11 MED ORDER — RAMIPRIL 5 MG PO CAPS: 5.0000 mg | ORAL_CAPSULE | Freq: Every day | ORAL | Status: DC

## 2013-08-11 MED ORDER — GLUCOSE BLOOD VI STRP: ORAL_STRIP | Status: DC

## 2013-08-11 MED ORDER — LEVOTHYROXINE SODIUM 175 MCG PO TABS: 175.0000 ug | ORAL_TABLET | Freq: Every day | ORAL | Status: DC

## 2013-08-11 MED ORDER — NIACIN ER (ANTIHYPERLIPIDEMIC) 500 MG PO TBCR: 500.0000 mg | EXTENDED_RELEASE_TABLET | Freq: Every day | ORAL | Status: DC

## 2013-08-11 MED ORDER — INSULIN REGULAR HUMAN 100 UNIT/ML IJ SOLN: 10.0000 [IU] | Freq: Two times a day (BID) | INTRAMUSCULAR | Status: DC

## 2013-08-11 MED ORDER — OMEGA-3-ACID ETHYL ESTERS 1 G PO CAPS: 1.0000 g | ORAL_CAPSULE | Freq: Every day | ORAL | Status: DC

## 2013-08-11 MED ORDER — INSULIN NPH (HUMAN) (ISOPHANE) 100 UNIT/ML ~~LOC~~ SUSP: 90.0000 [IU] | SUBCUTANEOUS | Status: DC

## 2013-08-11 MED ORDER — ONETOUCH ULTRASOFT LANCETS MISC: Status: DC

## 2013-08-11 MED ORDER — ATORVASTATIN CALCIUM 80 MG PO TABS: 80.0000 mg | ORAL_TABLET | Freq: Every day | ORAL | Status: DC

## 2013-08-11 NOTE — Telephone Encounter (Signed)
Patient notified that rxs up front for mail order and ready to pick up

## 2013-08-11 NOTE — Telephone Encounter (Signed)
Last seen B Oxford  08/10/13   If approved print for mail order and route to nurse

## 2013-08-17 ENCOUNTER — Other Ambulatory Visit: Payer: Self-pay | Admitting: Family Medicine

## 2013-08-20 ENCOUNTER — Other Ambulatory Visit: Payer: Self-pay | Admitting: Family Medicine

## 2013-08-21 ENCOUNTER — Other Ambulatory Visit: Payer: Self-pay

## 2013-08-21 NOTE — Telephone Encounter (Signed)
Last lipid 06/08/13    B Oxford  If approved print for mail order and route to nurse

## 2013-08-22 MED ORDER — OMEGA-3-ACID ETHYL ESTERS 1 G PO CAPS: 1.0000 g | ORAL_CAPSULE | Freq: Every day | ORAL | Status: DC

## 2013-08-24 ENCOUNTER — Telehealth: Payer: Self-pay | Admitting: Family Medicine

## 2013-08-28 ENCOUNTER — Telehealth: Payer: Self-pay | Admitting: Family Medicine

## 2013-09-01 NOTE — Telephone Encounter (Signed)
Please review

## 2013-09-01 NOTE — Telephone Encounter (Signed)
discontinue the Naprosyn. Continue to take the Zantac. Have patient to make another appointment to come back and he may need an injection in the tendon

## 2013-09-08 NOTE — Telephone Encounter (Signed)
Called patient; left message to call back.

## 2013-09-08 NOTE — Telephone Encounter (Signed)
This was taken care of on 08-28-13

## 2013-09-08 NOTE — Telephone Encounter (Signed)
NTBS.

## 2013-09-23 ENCOUNTER — Ambulatory Visit (INDEPENDENT_AMBULATORY_CARE_PROVIDER_SITE_OTHER): Payer: BC Managed Care – PPO | Admitting: Family Medicine

## 2013-09-23 ENCOUNTER — Encounter: Payer: Self-pay | Admitting: Family Medicine

## 2013-09-23 ENCOUNTER — Ambulatory Visit (INDEPENDENT_AMBULATORY_CARE_PROVIDER_SITE_OTHER): Payer: BC Managed Care – PPO

## 2013-09-23 VITALS — BP 124/71 | HR 59 | Temp 97.5°F | Ht 70.5 in | Wt 239.0 lb

## 2013-09-23 DIAGNOSIS — N400 Enlarged prostate without lower urinary tract symptoms: Secondary | ICD-10-CM

## 2013-09-23 DIAGNOSIS — M25511 Pain in right shoulder: Secondary | ICD-10-CM

## 2013-09-23 DIAGNOSIS — E559 Vitamin D deficiency, unspecified: Secondary | ICD-10-CM

## 2013-09-23 DIAGNOSIS — M25519 Pain in unspecified shoulder: Secondary | ICD-10-CM

## 2013-09-23 DIAGNOSIS — E039 Hypothyroidism, unspecified: Secondary | ICD-10-CM

## 2013-09-23 DIAGNOSIS — E119 Type 2 diabetes mellitus without complications: Secondary | ICD-10-CM

## 2013-09-23 DIAGNOSIS — E785 Hyperlipidemia, unspecified: Secondary | ICD-10-CM

## 2013-09-23 DIAGNOSIS — I10 Essential (primary) hypertension: Secondary | ICD-10-CM

## 2013-09-23 DIAGNOSIS — I2510 Atherosclerotic heart disease of native coronary artery without angina pectoris: Secondary | ICD-10-CM

## 2013-09-23 MED ORDER — TRAMADOL HCL 50 MG PO TABS: ORAL_TABLET | ORAL | Status: DC

## 2013-09-23 NOTE — Progress Notes (Signed)
Subjective:    Patient ID: Joseph Kirby, male    DOB: [birth year], 57 y.o.   MRN: [id number]  HPI Pt here for follow up and management of chronic medical problems.  He is followed by the endocrinologist Gennaro Africa MD at the [facility] in [place]. His last hemoglobin A1c was 7.7%. He's been having arthralgias and right shoulder pain with certain movements. He is due to get lab work. He is also due to get a Prevnar vaccine. He will check with his insurance regarding a Prevnar vaccine.        Patient Active Problem List   Diagnosis Date Noted  . Diabetes, insulin-dependent 06/20/2012  . CAD (coronary artery disease)   . Hypertension   . Hypothyroidism   . Ejection fraction   . DKA (diabetic ketoacidoses), history of 03/17/2012  . BPH (benign prostatic hyperplasia)   . Colon polyp   . DYSLIPIDEMIA 03/27/2010   Outpatient Encounter Prescriptions as of 09/23/2013  Medication Sig  . aspirin 81 MG tablet Take 81 mg by mouth daily.    Marland Kitchen atorvastatin (LIPITOR) 80 MG tablet Take 1 tablet (80 mg total) by mouth daily.  . Cholecalciferol (VITAMIN D) 2000 UNITS CAPS Take 2 capsules by mouth daily.  Marland Kitchen glucose blood (ONE TOUCH ULTRA TEST) test strip FSBS bid  . insulin NPH (HUMULIN N) 100 UNIT/ML injection Inject 90 Units into the skin as directed.  . insulin regular (HUMULIN R) 100 units/mL injection Inject 0.1 mLs (10 Units total) into the skin 2 (two) times daily before a meal.  . Lancets (ONETOUCH ULTRASOFT) lancets Use as instructed  . levothyroxine (SYNTHROID, LEVOTHROID) 175 MCG tablet Take 1 tablet (175 mcg total) by mouth daily.  Marland Kitchen LOVAZA 1 G capsule TAKE TWO CAPSULES BY MOUTH TWICE DAILY  . niacin (NIASPAN) 500 MG CR tablet Take 1,500 mg by mouth at bedtime.  . ramipril (ALTACE) 5 MG capsule Take 1 capsule (5 mg total) by mouth daily.  . [DISCONTINUED] naproxen (NAPROSYN) 500 MG tablet Take 1 tablet (500 mg total) by mouth 2 (two) times daily with a meal.  .  [DISCONTINUED] niacin (NIASPAN) 500 MG CR tablet Take 1 tablet (500 mg total) by mouth at bedtime.  . [DISCONTINUED] bismuth subsalicylate (PEPTO BISMOL) 262 MG/15ML suspension Take 15 mLs by mouth every 6 (six) hours as needed. For upset stomach   . [DISCONTINUED] nystatin-triamcinolone (MYCOLOG II) cream   . [DISCONTINUED] omega-3 acid ethyl esters (LOVAZA) 1 G capsule Take 1 capsule (1 g total) by mouth daily.  . [DISCONTINUED] ramipril (ALTACE) 5 MG capsule TAKE 1 CAPSULE BY MOUTH ONCE A DAY    Review of Systems  Constitutional: Negative.   HENT: Negative.   Eyes: Negative.   Respiratory: Negative.   Cardiovascular: Negative.   Gastrointestinal: Negative.   Endocrine: Negative.   Genitourinary: Negative.   Musculoskeletal: Positive for arthralgias (right arm pain above elbow).  Skin: Negative.   Allergic/Immunologic: Negative.   Neurological: Negative.   Hematological: Negative.   Psychiatric/Behavioral: Negative.        Objective:   Physical Exam  Nursing note and vitals reviewed. Constitutional: He is oriented to person, place, and time. He appears well-developed and well-nourished. No distress.  HENT:  Head: Normocephalic and atraumatic.  Right Ear: External ear normal.  Left Ear: External ear normal.  Nose: Nose normal.  Mouth/Throat: Oropharynx is clear and moist. No oropharyngeal exudate.  Eyes: Conjunctivae and EOM are normal. Pupils are equal, round, and reactive to light. Right  eye exhibits no discharge. Left eye exhibits no discharge. No scleral icterus.  Neck: Normal range of motion. Neck supple. No tracheal deviation present. No thyromegaly present.  Cardiovascular: Normal rate, regular rhythm, normal heart sounds and intact distal pulses.  Exam reveals no gallop and no friction rub.   No murmur heard. Pulmonary/Chest: Effort normal and breath sounds normal. No respiratory distress. He has no wheezes. He has no rales. He exhibits no tenderness.  Abdominal:  Soft. Bowel sounds are normal. He exhibits no mass. There is no tenderness. There is no rebound and no guarding.  Musculoskeletal: He exhibits tenderness. He exhibits no edema.  Limited range of motion especially with abduction of the right shoulder. He is also tender in the biceps tendon area insertion. There is no swelling. There is no redness. There is no rash. it is tender to palpation also.  Lymphadenopathy:    He has no cervical adenopathy.  Neurological: He is alert and oriented to person, place, and time. He has normal reflexes. No cranial nerve deficit.  Skin: Skin is warm and dry. No rash noted. No erythema. No pallor.  Psychiatric: He has a normal mood and affect. His behavior is normal. Judgment and thought content normal.   BP 124/71  Pulse 59  Temp(Src) 97.5 F (36.4 C) (Oral)  Ht 5' 10.5" (1.791 m)  Wt 239 lb (108.41 kg)  BMI 33.80 kg/m2 [facility] reading (PRIMARY) by  Dr.Nyazia Canevari-right shoulder-no active disease                                       Assessment & Plan:  1. BPH (benign prostatic hyperplasia) - POCT CBC; Future  2. CAD (coronary artery disease) - POCT CBC; Future - NMR, lipoprofile; Future  3. Diabetes, insulin-dependent - POCT CBC; Future - POCT glycosylated hemoglobin (Hb A1C); Future  4. DYSLIPIDEMIA - POCT CBC; Future - NMR, lipoprofile; Future  5. Hypertension - POCT CBC; Future - BMP8+EGFR; Future - Hepatic function panel; Future  6. Hypothyroidism - POCT CBC; Future  7. Vitamin D deficiency - Vit D  25 hydroxy (rtn osteoporosis monitoring); Future  8. Right shoulder pain - DG Shoulder Right; Future - Ambulatory referral to Physical Therapy - traMADol (ULTRAM) 50 MG tablet; 1/2  to1 tablet at bedtime if needed for severe pain in shoulder  Dispense: 30 tablet; Refill: 0 Patient Instructions  Continue current medications. Continue good therapeutic lifestyle changes which include good diet and exercise. Fall precautions discussed with  patient. Schedule your flu vaccine if you haven't had it yet If you are over 56 years old - you may need Prevnar 51 or the adult Pneumonia vaccine. We will arrange for you to see physical therapy to be evaluated and treated for the shoulder pain We will call you with the results of your lab work that you're going to have done and the right shoulder film report from the radiologist once it is available and In the meantime use warm wet compresses and gentle range of motion to the shoulder.   Arrie Senate MD

## 2013-09-23 NOTE — Patient Instructions (Addendum)
Continue current medications. Continue good therapeutic lifestyle changes which include good diet and exercise. Fall precautions discussed with patient. Schedule your flu vaccine if you haven't had it yet If you are over 57 years old - you may need Prevnar 13 or the adult Pneumonia vaccine. We will arrange for you to see physical therapy to be evaluated and treated for the shoulder pain We will call you with the results of your lab work that you're going to have done and the right shoulder film report from the radiologist once it is available and In the meantime use warm wet compresses and gentle range of motion to the shoulder.

## 2013-09-30 ENCOUNTER — Telehealth: Payer: Self-pay | Admitting: Family Medicine

## 2013-09-30 ENCOUNTER — Other Ambulatory Visit: Payer: Self-pay | Admitting: Family Medicine

## 2013-09-30 DIAGNOSIS — M25511 Pain in right shoulder: Secondary | ICD-10-CM

## 2013-09-30 NOTE — Telephone Encounter (Signed)
Pt wants to cancel PT and wants a referral for cortisone injection in right shoulder. Wants to go [facility]

## 2013-10-01 ENCOUNTER — Telehealth: Payer: Self-pay | Admitting: Family Medicine

## 2013-10-01 MED ORDER — NIACIN ER (ANTIHYPERLIPIDEMIC) 500 MG PO TBCR: 1500.0000 mg | EXTENDED_RELEASE_TABLET | Freq: Every day | ORAL | Status: DC

## 2013-10-01 NOTE — Telephone Encounter (Signed)
Referral was placed yesterday. Patient aware that it may take a week to schedule. Niacin prescription wasn't sent to pharmacy. I ordered that today.

## 2013-10-05 MED ORDER — NIACIN ER (ANTIHYPERLIPIDEMIC) 500 MG PO TBCR: 1500.0000 mg | EXTENDED_RELEASE_TABLET | Freq: Every day | ORAL | Status: DC

## 2013-10-05 NOTE — Telephone Encounter (Signed)
done

## 2013-10-20 ENCOUNTER — Ambulatory Visit: Payer: BC Managed Care – PPO | Attending: Orthopedic Surgery | Admitting: Physical Therapy

## 2013-10-20 DIAGNOSIS — M65979 Unspecified synovitis and tenosynovitis, unspecified ankle and foot: Secondary | ICD-10-CM | POA: Insufficient documentation

## 2013-10-20 DIAGNOSIS — R5381 Other malaise: Secondary | ICD-10-CM | POA: Insufficient documentation

## 2013-10-20 DIAGNOSIS — M25579 Pain in unspecified ankle and joints of unspecified foot: Secondary | ICD-10-CM | POA: Insufficient documentation

## 2013-10-20 DIAGNOSIS — M25519 Pain in unspecified shoulder: Secondary | ICD-10-CM | POA: Insufficient documentation

## 2013-10-20 DIAGNOSIS — IMO0001 Reserved for inherently not codable concepts without codable children: Secondary | ICD-10-CM | POA: Insufficient documentation

## 2013-10-20 DIAGNOSIS — M659 Synovitis and tenosynovitis, unspecified: Secondary | ICD-10-CM | POA: Insufficient documentation

## 2013-10-20 DIAGNOSIS — M25619 Stiffness of unspecified shoulder, not elsewhere classified: Secondary | ICD-10-CM | POA: Insufficient documentation

## 2013-10-20 DIAGNOSIS — M6580 Other synovitis and tenosynovitis, unspecified site: Secondary | ICD-10-CM | POA: Insufficient documentation

## 2013-10-23 ENCOUNTER — Ambulatory Visit: Payer: BC Managed Care – PPO | Admitting: Physical Therapy

## 2013-10-26 ENCOUNTER — Ambulatory Visit: Payer: BC Managed Care – PPO | Admitting: Physical Therapy

## 2013-10-29 ENCOUNTER — Telehealth: Payer: Self-pay | Admitting: Family Medicine

## 2013-10-29 ENCOUNTER — Ambulatory Visit: Payer: BC Managed Care – PPO | Admitting: Physical Therapy

## 2013-10-30 MED ORDER — NIACIN ER (ANTIHYPERLIPIDEMIC) 500 MG PO TBCR: 1500.0000 mg | EXTENDED_RELEASE_TABLET | Freq: Every day | ORAL | Status: DC

## 2013-10-30 MED ORDER — ATORVASTATIN CALCIUM 80 MG PO TABS: 80.0000 mg | ORAL_TABLET | Freq: Every day | ORAL | Status: DC

## 2013-10-30 MED ORDER — LEVOTHYROXINE SODIUM 175 MCG PO TABS: 175.0000 ug | ORAL_TABLET | Freq: Every day | ORAL | Status: DC

## 2013-10-30 MED ORDER — OMEGA-3-ACID ETHYL ESTERS 1 G PO CAPS: 2.0000 g | ORAL_CAPSULE | Freq: Two times a day (BID) | ORAL | Status: DC

## 2013-10-30 MED ORDER — INSULIN REGULAR HUMAN 100 UNIT/ML IJ SOLN: 10.0000 [IU] | Freq: Two times a day (BID) | INTRAMUSCULAR | Status: DC

## 2013-10-30 MED ORDER — RAMIPRIL 5 MG PO CAPS: 5.0000 mg | ORAL_CAPSULE | Freq: Every day | ORAL | Status: DC

## 2013-10-30 MED ORDER — INSULIN NPH (HUMAN) (ISOPHANE) 100 UNIT/ML ~~LOC~~ SUSP: 90.0000 [IU] | SUBCUTANEOUS | Status: DC

## 2013-10-30 NOTE — Telephone Encounter (Signed)
[facility]

## 2013-11-02 ENCOUNTER — Ambulatory Visit: Payer: BC Managed Care – PPO | Admitting: Physical Therapy

## 2013-11-04 ENCOUNTER — Ambulatory Visit: Payer: BC Managed Care – PPO | Admitting: Physical Therapy

## 2014-01-18 ENCOUNTER — Encounter: Payer: Self-pay | Admitting: Family Medicine

## 2014-01-18 ENCOUNTER — Ambulatory Visit (INDEPENDENT_AMBULATORY_CARE_PROVIDER_SITE_OTHER): Payer: BC Managed Care – PPO | Admitting: Family Medicine

## 2014-01-18 VITALS — BP 117/69 | HR 91 | Temp 98.3°F | Ht 70.5 in | Wt 240.0 lb

## 2014-01-18 DIAGNOSIS — E119 Type 2 diabetes mellitus without complications: Secondary | ICD-10-CM

## 2014-01-18 DIAGNOSIS — I10 Essential (primary) hypertension: Secondary | ICD-10-CM

## 2014-01-18 DIAGNOSIS — N400 Enlarged prostate without lower urinary tract symptoms: Secondary | ICD-10-CM

## 2014-01-18 DIAGNOSIS — E039 Hypothyroidism, unspecified: Secondary | ICD-10-CM

## 2014-01-18 DIAGNOSIS — E785 Hyperlipidemia, unspecified: Secondary | ICD-10-CM

## 2014-01-18 NOTE — Progress Notes (Signed)
Subjective:    Patient ID: Joseph Kirby, male    DOB: May 02, 1956, 58 y.o.   MRN: [id number]  HPI Pt here for follow up and management of chronic medical problems. The patient had recent lab work by his endocrinologist. These numbers were reviewed with the patient today. His hemoglobin A1c is elevated and he has had some isolated elevated blood sugars that were too high. He is also seeing the orthopedist regarding his right shoulder pain. He recently had an MRI but is not aware of the report result. The patient is currently working part-time at [employer].        Patient Active Problem List   Diagnosis Date Noted  . Diabetes, insulin-dependent 06/20/2012  . CAD (coronary artery disease)   . Hypertension   . Hypothyroidism   . Ejection fraction   . DKA (diabetic ketoacidoses), history of 03/17/2012  . BPH (benign prostatic hyperplasia)   . Colon polyp   . DYSLIPIDEMIA 03/27/2010   Outpatient Encounter Prescriptions as of 01/18/2014  Medication Sig  . aspirin 81 MG tablet Take 81 mg by mouth daily.    Marland Kitchen. atorvastatin (LIPITOR) 80 MG tablet Take 1 tablet (80 mg total) by mouth daily.  . Cholecalciferol (VITAMIN D) 2000 UNITS CAPS Take 2 capsules by mouth daily.  Marland Kitchen. glucose blood (ONE TOUCH ULTRA TEST) test strip FSBS bid  . insulin NPH Human (HUMULIN N) 100 UNIT/ML injection Inject 90 Units into the skin as directed.  . insulin regular (HUMULIN R) 100 units/mL injection Inject 0.1 mLs (10 Units total) into the skin 2 (two) times daily before a meal.  . Lancets (ONETOUCH ULTRASOFT) lancets Use as instructed  . levothyroxine (SYNTHROID, LEVOTHROID) 175 MCG tablet Take 175 mcg by mouth daily. None on Sundays  . niacin (NIASPAN) 500 MG CR tablet Take 3 tablets (1,500 mg total) by mouth at bedtime.  Marland Kitchen. omega-3 acid ethyl esters (LOVAZA) 1 G capsule Take 2 capsules (2 g total) by mouth 2 (two) times daily.  . ramipril (ALTACE) 5 MG capsule Take 1 capsule (5 mg total) by mouth  daily.  . [DISCONTINUED] levothyroxine (SYNTHROID, LEVOTHROID) 175 MCG tablet Take 1 tablet (175 mcg total) by mouth daily.  . [DISCONTINUED] traMADol (ULTRAM) 50 MG tablet 1/2  to1 tablet at bedtime if needed for severe pain in shoulder    Review of Systems  Constitutional: Negative.   HENT: Negative.   Eyes: Negative.   Respiratory: Negative.   Cardiovascular: Negative.   Gastrointestinal: Negative.   Endocrine: Negative.   Genitourinary: Negative.   Musculoskeletal: Positive for arthralgias (still having right shoulder pain-  going to [place] [facility]).  Skin: Negative.   Allergic/Immunologic: Negative.   Neurological: Negative.   Hematological: Negative.   Psychiatric/Behavioral: Negative.        Objective:   Physical Exam  Nursing note and vitals reviewed. Constitutional: He is oriented to person, place, and time. He appears well-developed and well-nourished. No distress.  HENT:  Head: Normocephalic and atraumatic.  Right Ear: External ear normal.  Left Ear: External ear normal.  Nose: Nose normal.  Mouth/Throat: Oropharynx is clear and moist. No oropharyngeal exudate.  Eyes: Conjunctivae and EOM are normal. Pupils are equal, round, and reactive to light. Right eye exhibits no discharge. Left eye exhibits no discharge. No scleral icterus.  Neck: Normal range of motion. Neck supple. No thyromegaly present.  Cardiovascular: Normal rate, regular rhythm, normal heart sounds and intact distal pulses.  Exam reveals no gallop and no  friction rub.   No murmur heard. At 72 per minute  Pulmonary/Chest: Effort normal and breath sounds normal. No respiratory distress. He has no wheezes. He has no rales. He exhibits no tenderness.  Abdominal: Soft. Bowel sounds are normal. He exhibits no mass. There is no tenderness. There is no rebound and no guarding.  Genitourinary: Rectum normal and penis normal.  The prostate was enlarged but soft and smooth. There are no rectal masses. There is  no inguinal hernia. The external genitalia were within normal limits. There were no inguinal nodes   Musculoskeletal: Normal range of motion. He exhibits no edema and no tenderness.  Lymphadenopathy:    He has no cervical adenopathy.  Neurological: He is alert and oriented to person, place, and time. He has normal reflexes. No cranial nerve deficit.  Skin: Skin is warm and dry. No rash noted. No erythema. No pallor.  Psychiatric: He has a normal mood and affect. His behavior is normal. Judgment and thought content normal.   BP 117/69  Pulse 91  Temp(Src) 98.3 F (36.8 C) (Oral)  Ht 5' 10.5" (1.791 m)  Wt 240 lb (108.863 kg)  BMI 33.94 kg/m2        Assessment & Plan:  1. BPH (benign prostatic hyperplasia)  2. Diabetes, insulin-dependent -Try to get better control of the blood sugar especially on the days that you're not working  3. DYSLIPIDEMIA -Continue current treatment and aggressive therapy lifestyle changes  4. Hypothyroidism -Reduce levothyroxine to 1 daily on Monday through Friday and one half on Saturday and Sunday -Recheck thyroid profile in 6 weeks  5. Hypertension -Continue current treatment  Patient Instructions  Continue current medications. Continue good therapeutic lifestyle changes which include good diet and exercise. Fall precautions discussed with patient. If an FOBT was given today- please return it to our front desk. If you are over 57 years old - you may need Prevnar 13 or the adult Pneumonia vaccine.  Continue to monitor blood sugars closely at home and try to get better control of these Please check with your insurance regarding the cost of the Prevnar vaccine Please keep appointments regularly with the ophthalmologist and with the endocrinologist so that you can get your blood sugar under better control    Nyra Capeson W. Rosalynn Sergent MD

## 2014-01-18 NOTE — Patient Instructions (Addendum)
Continue current medications. Continue good therapeutic lifestyle changes which include good diet and exercise. Fall precautions discussed with patient. If an FOBT was given today- please return it to our front desk. If you are over 57 years old - you may need Prevnar 13 or the adult Pneumonia vaccine.  Continue to monitor blood sugars closely at home and try to get better control of these Please check with your insurance regarding the cost of the Prevnar vaccine Please keep appointments regularly with the ophthalmologist and with the endocrinologist so that you can get your blood sugar under better control

## 2014-01-25 ENCOUNTER — Telehealth: Payer: Self-pay | Admitting: Family Medicine

## 2014-01-25 NOTE — Telephone Encounter (Signed)
Patient said MRI at [place] [facility] showed inflammation he was wondering if you could give him some med's for it?

## 2014-01-25 NOTE — Telephone Encounter (Signed)
Please try to get copy of report so that we know what the orthopedist decided to do. Let patient know that we are working on this.

## 2014-01-26 NOTE — Telephone Encounter (Signed)
Pt aware that we will request record of MRI And that [facility] will then review

## 2014-02-08 ENCOUNTER — Other Ambulatory Visit (INDEPENDENT_AMBULATORY_CARE_PROVIDER_SITE_OTHER): Payer: BC Managed Care – PPO

## 2014-02-08 DIAGNOSIS — I10 Essential (primary) hypertension: Secondary | ICD-10-CM

## 2014-02-08 DIAGNOSIS — E785 Hyperlipidemia, unspecified: Secondary | ICD-10-CM

## 2014-02-08 DIAGNOSIS — Z1212 Encounter for screening for malignant neoplasm of rectum: Secondary | ICD-10-CM

## 2014-02-08 DIAGNOSIS — E559 Vitamin D deficiency, unspecified: Secondary | ICD-10-CM

## 2014-02-08 LAB — POCT CBC
Granulocyte percent: 64.8 %G (ref 37–80)
HCT, POC: 45.5 % (ref 43.5–53.7)
HEMOGLOBIN: 15.2 g/dL (ref 14.1–18.1)
Lymph, poc: 1.3 (ref 0.6–3.4)
MCH, POC: 28.8 pg (ref 27–31.2)
MCHC: 33.4 g/dL (ref 31.8–35.4)
MCV: 86 fL (ref 80–97)
MPV: 7.7 fL (ref 0–99.8)
POC GRANULOCYTE: 3.1 (ref 2–6.9)
POC LYMPH PERCENT: 27.5 %L (ref 10–50)
Platelet Count, POC: 240 10*3/uL (ref 142–424)
RBC: 5.3 M/uL (ref 4.69–6.13)
RDW, POC: 13.3 %
WBC: 4.8 10*3/uL (ref 4.6–10.2)

## 2014-02-08 NOTE — Progress Notes (Signed)
PT CAME IN FOR LABS ONLY 

## 2014-02-08 NOTE — Progress Notes (Signed)
PT DROPPED OFF FOBT

## 2014-02-08 NOTE — Addendum Note (Signed)
Addended by: Prescott Gum on: 02/08/2014 09:22 AM   Modules accepted: Orders

## 2014-02-09 LAB — HEPATIC FUNCTION PANEL
ALBUMIN: 4 g/dL (ref 3.5–5.5)
ALK PHOS: 100 IU/L (ref 39–117)
ALT: 31 IU/L (ref 0–44)
AST: 23 IU/L (ref 0–40)
BILIRUBIN TOTAL: 0.9 mg/dL (ref 0.0–1.2)
Bilirubin, Direct: 0.24 mg/dL (ref 0.00–0.40)
Total Protein: 6 g/dL (ref 6.0–8.5)

## 2014-02-09 LAB — NMR, LIPOPROFILE
Cholesterol: 119 mg/dL (ref 100–199)
HDL CHOLESTEROL BY NMR: 41 mg/dL (ref >39)
HDL PARTICLE NUMBER: 25.7 umol/L — AB (ref >=30.5)
LDL Particle Number: 891 nmol/L (ref <1000)
LDL Size: 20.6 nm (ref >20.5)
LDLC SERPL CALC-MCNC: 67 mg/dL (ref 0–99)
LP-IR SCORE: 31 (ref <=45)
Small LDL Particle Number: 374 nmol/L (ref <=527)
Triglycerides by NMR: 54 mg/dL (ref 0–149)

## 2014-02-09 LAB — BMP8+EGFR
BUN / CREAT RATIO: 14 (ref 9–20)
BUN: 14 mg/dL (ref 6–24)
CHLORIDE: 101 mmol/L (ref 97–108)
CO2: 26 mmol/L (ref 18–29)
Calcium: 9.1 mg/dL (ref 8.7–10.2)
Creatinine, Ser: 0.98 mg/dL (ref 0.76–1.27)
GFR calc Af Amer: 99 mL/min/{1.73_m2} (ref >59)
GFR calc non Af Amer: 86 mL/min/{1.73_m2} (ref >59)
Glucose: 197 mg/dL — ABNORMAL HIGH (ref 65–99)
Potassium: 4.6 mmol/L (ref 3.5–5.2)
SODIUM: 139 mmol/L (ref 134–144)

## 2014-02-09 LAB — FECAL OCCULT BLOOD, IMMUNOCHEMICAL: FECAL OCCULT BLD: NEGATIVE

## 2014-02-09 LAB — VITAMIN D 25 HYDROXY (VIT D DEFICIENCY, FRACTURES): VIT D 25 HYDROXY: 56.4 ng/mL (ref 30.0–100.0)

## 2014-02-10 ENCOUNTER — Telehealth: Payer: Self-pay | Admitting: Family Medicine

## 2014-02-10 NOTE — Telephone Encounter (Signed)
Message copied by Doreatha Massed on Wed Feb 10, 2014 10:48 AM ------      Message from: Ernestina Penna      Created: Tue Feb 09, 2014  7:50 AM       Liver function tests are within normal limits      The blood sugar was elevated at 197. The creatinine, the most important kidney function test is within normal limits. The electrolytes including potassium are within normal limits.      Cholesterol numbers by advanced lipid testing or excellent and at goal except the good cholesterol is low, continue current treatment and as aggressive therapeutic lifestyle changes as possible      The vitamin D level is excellent, continue current treatment ------

## 2014-02-12 NOTE — Telephone Encounter (Signed)
Taken care of in result notes 

## 2014-02-24 ENCOUNTER — Encounter: Payer: Self-pay | Admitting: *Deleted

## 2014-03-01 ENCOUNTER — Telehealth: Payer: Self-pay | Admitting: Family Medicine

## 2014-03-02 MED ORDER — "INSULIN SYRINGE 29G X 1/2"" 1 ML MISC": 1.0000 | Freq: Three times a day (TID) | Status: DC

## 2014-03-02 MED ORDER — INSULIN NPH (HUMAN) (ISOPHANE) 100 UNIT/ML ~~LOC~~ SUSP: 90.0000 [IU] | SUBCUTANEOUS | Status: DC

## 2014-03-02 MED ORDER — INSULIN REGULAR HUMAN 100 UNIT/ML IJ SOLN: 10.0000 [IU] | Freq: Two times a day (BID) | INTRAMUSCULAR | Status: DC

## 2014-03-02 MED ORDER — NIACIN ER (ANTIHYPERLIPIDEMIC) 500 MG PO TBCR: 1500.0000 mg | EXTENDED_RELEASE_TABLET | Freq: Every day | ORAL | Status: DC

## 2014-03-02 NOTE — Telephone Encounter (Signed)
rx ready for pickup 

## 2014-03-02 NOTE — Telephone Encounter (Signed)
Patient is requesting printed prescriptions for mail order. Dr. Christell ConstantMoore will be out of the office until next week. If approved can you review, print and route to nurse to have patient pickup.

## 2014-03-03 NOTE — Telephone Encounter (Signed)
Patient aware.

## 2014-03-17 ENCOUNTER — Telehealth: Payer: Self-pay | Admitting: Family Medicine

## 2014-03-17 NOTE — Telephone Encounter (Signed)
The patient needs to make another appointment to come back and let me see him and we will reevaluate what we need to do to help him get better

## 2014-03-18 NOTE — Telephone Encounter (Signed)
Patient wife aware to make an appointment

## 2014-03-22 ENCOUNTER — Encounter: Payer: Self-pay | Admitting: Family Medicine

## 2014-03-22 ENCOUNTER — Ambulatory Visit (INDEPENDENT_AMBULATORY_CARE_PROVIDER_SITE_OTHER): Payer: BC Managed Care – PPO | Admitting: Family Medicine

## 2014-03-22 VITALS — BP 118/64 | HR 74 | Temp 97.3°F | Ht 70.5 in | Wt 237.0 lb

## 2014-03-22 DIAGNOSIS — M25511 Pain in right shoulder: Secondary | ICD-10-CM

## 2014-03-22 DIAGNOSIS — E118 Type 2 diabetes mellitus with unspecified complications: Secondary | ICD-10-CM

## 2014-03-22 DIAGNOSIS — K219 Gastro-esophageal reflux disease without esophagitis: Secondary | ICD-10-CM

## 2014-03-22 DIAGNOSIS — M25519 Pain in unspecified shoulder: Secondary | ICD-10-CM

## 2014-03-22 LAB — POCT HEMOGLOBIN: Hemoglobin: 15.2 g/dL (ref 14.1–18.1)

## 2014-03-22 MED ORDER — NYSTATIN-TRIAMCINOLONE 100000-0.1 UNIT/GM-% EX CREA
1.0000 | TOPICAL_CREAM | Freq: Two times a day (BID) | CUTANEOUS | Status: DC
Start: 2014-03-22 — End: 2015-08-31

## 2014-03-22 MED ORDER — MELOXICAM 15 MG PO TABS: 15.0000 mg | ORAL_TABLET | Freq: Every day | ORAL | Status: DC

## 2014-03-22 NOTE — Patient Instructions (Signed)
Take meloxicam as directed Use warm wet compresses 20 minutes 3 or 4 times daily Do regular range of motion exercises Take Zantac 150 twice daily before breakfast and supper to protect the stomach from meloxicam which you will take once a day after breakfast

## 2014-03-22 NOTE — Progress Notes (Signed)
Subjective:    Patient ID: Joseph Kirby, male    DOB: 1956-03-22, 58 y.o.   MRN: [id number]  HPI Patient here today for follow up of right shoulder pain. Patient has had problems with his shoulder since January. He has had injections and has seen the orthopedic surgeon. He is still having problems with her shoulder. He has moderate a.c. joint arthrosis, mild subacromial subdeltoid bursitis, and some inflammation involving the biceps tendon sheath. He did receive an injection from the orthopedist but is still having problems. He was reluctant to take any anti-inflammatory medicines because of his acid reflux. He does have a normal creatinine. He has had physical therapy, has taken meloxicam 7.5 mg, and this is still not helped his shoulder.          Patient Active Problem List   Diagnosis Date Noted  . Diabetes, insulin-dependent 06/20/2012  . CAD (coronary artery disease)   . Hypertension   . Hypothyroidism   . Ejection fraction   . DKA (diabetic ketoacidoses), history of 03/17/2012  . BPH (benign prostatic hyperplasia)   . Colon polyp   . DYSLIPIDEMIA 03/27/2010   Outpatient Encounter Prescriptions as of 03/22/2014  Medication Sig  . aspirin 81 MG tablet Take 81 mg by mouth daily.    Marland Kitchen atorvastatin (LIPITOR) 80 MG tablet Take 1 tablet (80 mg total) by mouth daily.  . Cholecalciferol (VITAMIN D) 2000 UNITS CAPS Take 2 capsules by mouth daily.  Marland Kitchen glucose blood (ONE TOUCH ULTRA TEST) test strip FSBS bid  . insulin NPH Human (HUMULIN N) 100 UNIT/ML injection Inject 0.9 mLs (90 Units total) into the skin as directed.  . insulin regular (HUMULIN R) 100 units/mL injection Inject 0.1 mLs (10 Units total) into the skin 2 (two) times daily before a meal.  . INSULIN SYRINGE 1CC/29G 29G X 1/2" 1 ML MISC 1 each by Does not apply route 3 (three) times daily.  . Lancets (ONETOUCH ULTRASOFT) lancets Use as instructed  . levothyroxine (SYNTHROID, LEVOTHROID) 175 MCG tablet Take 175 mcg by mouth  daily. None on Sundays  . niacin (NIASPAN) 500 MG CR tablet Take 3 tablets (1,500 mg total) by mouth at bedtime.  Marland Kitchen nystatin-triamcinolone (MYCOLOG II) cream Apply 1 application topically 2 (two) times daily.  Marland Kitchen omega-3 acid ethyl esters (LOVAZA) 1 G capsule Take 2 capsules (2 g total) by mouth 2 (two) times daily.  . ramipril (ALTACE) 5 MG capsule Take 1 capsule (5 mg total) by mouth daily.  . meloxicam (MOBIC) 7.5 MG tablet Take 1 tablet by mouth daily.    Review of Systems  Constitutional: Negative.   HENT: Negative.   Eyes: Negative.   Respiratory: Negative.   Cardiovascular: Negative.   Gastrointestinal: Negative.   Endocrine: Negative.   Genitourinary: Negative.   Musculoskeletal: Positive for arthralgias (right shoulder pain).  Skin: Negative.   Allergic/Immunologic: Negative.   Neurological: Negative.   Hematological: Negative.   Psychiatric/Behavioral: Negative.        Objective:   Physical Exam  Nursing note and vitals reviewed. Constitutional: He is oriented to person, place, and time. He appears well-developed and well-nourished. No distress.  HENT:  Head: Normocephalic and atraumatic.  Mouth/Throat: Oropharynx is clear and moist.  Eyes: Conjunctivae and EOM are normal. Pupils are equal, round, and reactive to light. Right eye exhibits no discharge. Left eye exhibits no discharge. No scleral icterus.  Cardiovascular: Normal rate, regular rhythm and normal heart sounds.   Pulmonary/Chest: Effort normal and breath sounds  normal. No respiratory distress. He has no wheezes. He has no rales. He exhibits no tenderness.  Abdominal: Soft. Bowel sounds are normal. He exhibits no mass. There is no tenderness. There is no rebound and no guarding.  Musculoskeletal: Normal range of motion. He exhibits tenderness. He exhibits no edema.  Limited range of motion of right shoulder secondary to pain especially with abduction, the patient was tender over the shoulder in general    Lymphadenopathy:    He has no cervical adenopathy.  Neurological: He is alert and oriented to person, place, and time.  Skin: Skin is warm and dry. No rash noted.  Psychiatric: He has a normal mood and affect. His behavior is normal. Judgment and thought content normal.   BP 118/64  Pulse 74  Temp(Src) 97.3 F (36.3 C) (Oral)  Ht 5' 10.5" (1.791 m)  Wt 237 lb (107.502 kg)  BMI 33.51 kg/m2        Assessment & Plan:  1. Right shoulder pain - meloxicam (MOBIC) 15 MG tablet; Take 1 tablet (15 mg total) by mouth daily.  Dispense: 30 tablet; Refill: 0  2. Gastroesophageal reflux disease without esophagitis - POCT hemoglobin - BMP8+EGFR  3. Type 2 diabetes mellitus with complication  Patient Instructions  Take meloxicam as directed Use warm wet compresses 20 minutes 3 or 4 times daily Do regular range of motion exercises Take Zantac 150 twice daily before breakfast and supper to protect the stomach from meloxicam which you will take once a day after breakfast   Arrie Senate MD

## 2014-03-23 LAB — BMP8+EGFR
BUN/Creatinine Ratio: 16 (ref 9–20)
BUN: 16 mg/dL (ref 6–24)
CO2: 24 mmol/L (ref 18–29)
Calcium: 9.1 mg/dL (ref 8.7–10.2)
Chloride: 104 mmol/L (ref 97–108)
Creatinine, Ser: 0.99 mg/dL (ref 0.76–1.27)
GFR calc Af Amer: 97 mL/min/{1.73_m2} (ref >59)
GFR calc non Af Amer: 84 mL/min/{1.73_m2} (ref >59)
Glucose: 259 mg/dL — ABNORMAL HIGH (ref 65–99)
Potassium: 4.4 mmol/L (ref 3.5–5.2)
Sodium: 141 mmol/L (ref 134–144)

## 2014-03-24 ENCOUNTER — Encounter: Payer: Self-pay | Admitting: *Deleted

## 2014-03-24 ENCOUNTER — Telehealth: Payer: Self-pay | Admitting: Family Medicine

## 2014-03-26 ENCOUNTER — Other Ambulatory Visit: Payer: Self-pay | Admitting: *Deleted

## 2014-03-26 DIAGNOSIS — E039 Hypothyroidism, unspecified: Secondary | ICD-10-CM

## 2014-03-30 MED ORDER — LEVOTHYROXINE SODIUM 175 MCG PO TABS: 175.0000 ug | ORAL_TABLET | Freq: Every day | ORAL | Status: DC

## 2014-03-30 NOTE — Telephone Encounter (Signed)
Contacted pt, he will come in for Thyroid labs, ok to refill local x 1, will refill mail order after labs

## 2014-04-06 ENCOUNTER — Other Ambulatory Visit (INDEPENDENT_AMBULATORY_CARE_PROVIDER_SITE_OTHER): Payer: BC Managed Care – PPO

## 2014-04-06 DIAGNOSIS — E039 Hypothyroidism, unspecified: Secondary | ICD-10-CM

## 2014-04-07 LAB — THYROID PANEL WITH TSH
Free Thyroxine Index: 2.1 (ref 1.2–4.9)
T3 UPTAKE RATIO: 37 % (ref 24–39)
T4, Total: 5.8 ug/dL (ref 4.5–12.0)
TSH: 1.86 u[IU]/mL (ref 0.450–4.500)

## 2014-04-13 ENCOUNTER — Encounter: Payer: Self-pay | Admitting: *Deleted

## 2014-04-22 ENCOUNTER — Telehealth: Payer: Self-pay | Admitting: Family Medicine

## 2014-04-22 MED ORDER — LEVOTHYROXINE SODIUM 175 MCG PO TABS: 175.0000 ug | ORAL_TABLET | Freq: Every day | ORAL | Status: DC

## 2014-04-22 NOTE — Telephone Encounter (Signed)
Refill sent electronically

## 2014-05-03 ENCOUNTER — Encounter: Payer: Self-pay | Admitting: Family Medicine

## 2014-05-03 ENCOUNTER — Ambulatory Visit (INDEPENDENT_AMBULATORY_CARE_PROVIDER_SITE_OTHER): Payer: BC Managed Care – PPO | Admitting: Family Medicine

## 2014-05-03 VITALS — BP 117/69 | HR 69 | Temp 98.9°F | Ht 70.5 in | Wt 241.0 lb

## 2014-05-03 DIAGNOSIS — I10 Essential (primary) hypertension: Secondary | ICD-10-CM

## 2014-05-03 DIAGNOSIS — M25519 Pain in unspecified shoulder: Secondary | ICD-10-CM

## 2014-05-03 DIAGNOSIS — E118 Type 2 diabetes mellitus with unspecified complications: Secondary | ICD-10-CM

## 2014-05-03 DIAGNOSIS — E039 Hypothyroidism, unspecified: Secondary | ICD-10-CM

## 2014-05-03 DIAGNOSIS — N400 Enlarged prostate without lower urinary tract symptoms: Secondary | ICD-10-CM

## 2014-05-03 DIAGNOSIS — M25511 Pain in right shoulder: Secondary | ICD-10-CM

## 2014-05-03 DIAGNOSIS — E785 Hyperlipidemia, unspecified: Secondary | ICD-10-CM

## 2014-05-03 DIAGNOSIS — E559 Vitamin D deficiency, unspecified: Secondary | ICD-10-CM

## 2014-05-03 MED ORDER — MELOXICAM 15 MG PO TABS: 7.5000 mg | ORAL_TABLET | Freq: Every day | ORAL | Status: DC

## 2014-05-03 NOTE — Patient Instructions (Addendum)
Continue current medications. Continue good therapeutic lifestyle changes which include good diet and exercise. Fall precautions discussed with patient. If an FOBT was given today- please return it to our front desk. If you are over 57 years old - you may need Prevnar 13 or the adult Pneumonia vaccine.  Flu Shots will be available at our office starting mid- September. Please call and schedule a FLU CLINIC APPOINTMENT.   Continue followup with endocrinologist Continue Mobic one half pill daily at least until lab work is done Followup with orthopedist in our office in September for shoulder pain

## 2014-05-03 NOTE — Progress Notes (Signed)
Subjective:    Patient ID: Joseph Kirby, male    DOB: November 07, 1955, 58 y.o.   MRN: [id number]  HPI Pt here for follow up and management of chronic medical problems.         Patient Active Problem List   Diagnosis Date Noted  . Diabetes, insulin-dependent 06/20/2012  . CAD (coronary artery disease)   . Hypertension   . Hypothyroidism   . Ejection fraction   . DKA (diabetic ketoacidoses), history of 03/17/2012  . BPH (benign prostatic hyperplasia)   . Colon polyp   . DYSLIPIDEMIA 03/27/2010   Outpatient Encounter Prescriptions as of 05/03/2014  Medication Sig  . aspirin 81 MG tablet Take 81 mg by mouth daily.    Marland Kitchen atorvastatin (LIPITOR) 80 MG tablet Take 1 tablet (80 mg total) by mouth daily.  . Cholecalciferol (VITAMIN D) 2000 UNITS CAPS Take 2 capsules by mouth daily.  Marland Kitchen glucose blood (ONE TOUCH ULTRA TEST) test strip FSBS bid  . insulin NPH Human (HUMULIN N) 100 UNIT/ML injection Inject 0.9 mLs (90 Units total) into the skin as directed.  . insulin regular (HUMULIN R) 100 units/mL injection Inject 0.1 mLs (10 Units total) into the skin 2 (two) times daily before a meal.  . INSULIN SYRINGE 1CC/29G 29G X 1/2" 1 ML MISC 1 each by Does not apply route 3 (three) times daily.  . Lancets (ONETOUCH ULTRASOFT) lancets Use as instructed  . levothyroxine (SYNTHROID, LEVOTHROID) 175 MCG tablet Take 1 tablet (175 mcg total) by mouth daily. None on Sundays  . meloxicam (MOBIC) 15 MG tablet Take 1 tablet (15 mg total) by mouth daily.  . niacin (NIASPAN) 500 MG CR tablet Take 3 tablets (1,500 mg total) by mouth at bedtime.  Marland Kitchen nystatin-triamcinolone (MYCOLOG II) cream Apply 1 application topically 2 (two) times daily.  Marland Kitchen omega-3 acid ethyl esters (LOVAZA) 1 G capsule Take 2 capsules (2 g total) by mouth 2 (two) times daily.  . ramipril (ALTACE) 5 MG capsule Take 1 capsule (5 mg total) by mouth daily.  . ranitidine (ZANTAC) 150 MG tablet Take 150 mg by mouth 2 (two) times daily.    Review of  Systems  Constitutional: Negative.   HENT: Negative.   Eyes: Negative.   Respiratory: Negative.   Cardiovascular: Negative.   Gastrointestinal: Negative.   Endocrine: Negative.   Genitourinary: Negative.   Musculoskeletal: Positive for arthralgias (bilateral shoulder pain).  Skin: Negative.   Allergic/Immunologic: Negative.   Neurological: Negative.   Hematological: Negative.   Psychiatric/Behavioral: Negative.        Objective:   Physical Exam  Nursing note and vitals reviewed. Constitutional: He is oriented to person, place, and time. He appears well-developed and well-nourished. No distress.  HENT:  Head: Normocephalic and atraumatic.  Right Ear: External ear normal.  Left Ear: External ear normal.  Nose: Nose normal.  Mouth/Throat: Oropharynx is clear and moist. No oropharyngeal exudate.  Eyes: Conjunctivae and EOM are normal. Pupils are equal, round, and reactive to light. Right eye exhibits no discharge. Left eye exhibits no discharge. No scleral icterus.  Neck: Normal range of motion. Neck supple. No thyromegaly present.  No carotid bruits or adenopathy  Cardiovascular: Normal rate, regular rhythm, normal heart sounds and intact distal pulses.  Exam reveals no gallop and no friction rub.   No murmur heard. The heart is 72 per minute with a regular rate and rhythm  Pulmonary/Chest: Effort normal and breath sounds normal. No respiratory distress. He has no wheezes. He has  no rales. He exhibits no tenderness.   No axillary nodes or masses  Abdominal: Soft. Bowel sounds are normal. He exhibits no mass. There is no tenderness. There is no rebound and no guarding.  The abdomen is obese without tenderness or abdominal bruit, no inguinal nodes were palpable  Genitourinary: Rectum normal.  Musculoskeletal: Normal range of motion. He exhibits no edema and no tenderness.  Lymphadenopathy:    He has no cervical adenopathy.  Neurological: He is alert and oriented to person, place,  and time. He has normal reflexes. No cranial nerve deficit.  Skin: Skin is warm and dry. No rash noted. No erythema. No pallor.  Psychiatric: He has a normal mood and affect. His behavior is normal. Judgment and thought content normal.   BP 117/69  Pulse 69  Temp(Src) 98.9 F (37.2 C) (Oral)  Ht 5' 10.5" (1.791 m)  Wt 241 lb (109.317 kg)  BMI 34.08 kg/m2        Assessment & Plan:  1. Type 2 diabetes mellitus with complication - POCT UA - Microalbumin; Future - POCT glycosylated hemoglobin (Hb A1C); Future  2. Hypothyroidism, unspecified hypothyroidism type - Thyroid Panel With TSH; Future  3. Essential hypertension - BMP8+EGFR; Future - Hepatic function panel; Future  4. DYSLIPIDEMIA - NMR, lipoprofile; Future  5. BPH (benign prostatic hyperplasia)  6. Vitamin D deficiency - Vit D  25 hydroxy (rtn osteoporosis monitoring); Future  7. Right shoulder pain - meloxicam (MOBIC) 15 MG tablet; Take 0.5 tablets (7.5 mg total) by mouth daily.  Dispense: 30 tablet; Refill: 0 - Ambulatory referral to Orthopedic Surgery  Patient Instructions  Continue current medications. Continue good therapeutic lifestyle changes which include good diet and exercise. Fall precautions discussed with patient. If an FOBT was given today- please return it to our front desk. If you are over 30 years old - you may need Prevnar 48 or the adult Pneumonia vaccine.  Flu Shots will be available at our office starting mid- September. Please call and schedule a FLU CLINIC APPOINTMENT.   Continue followup with endocrinologist Continue Mobic one half pill daily at least until lab work is done Followup with orthopedist in our office in September for shoulder pain   Arrie Senate MD

## 2014-05-18 ENCOUNTER — Other Ambulatory Visit (INDEPENDENT_AMBULATORY_CARE_PROVIDER_SITE_OTHER): Payer: BC Managed Care – PPO

## 2014-05-18 DIAGNOSIS — E118 Type 2 diabetes mellitus with unspecified complications: Secondary | ICD-10-CM

## 2014-05-18 LAB — POCT GLYCOSYLATED HEMOGLOBIN (HGB A1C): Hemoglobin A1C: 7.4

## 2014-05-20 LAB — HEPATIC FUNCTION PANEL
ALBUMIN: 4 g/dL (ref 3.5–5.5)
ALK PHOS: 113 IU/L (ref 39–117)
ALT: 35 IU/L (ref 0–44)
AST: 33 IU/L (ref 0–40)
BILIRUBIN DIRECT: 0.17 mg/dL (ref 0.00–0.40)
Total Bilirubin: 0.6 mg/dL (ref 0.0–1.2)
Total Protein: 6.2 g/dL (ref 6.0–8.5)

## 2014-05-20 LAB — NMR, LIPOPROFILE
CHOLESTEROL: 139 mg/dL (ref 100–199)
HDL Cholesterol by NMR: 44 mg/dL (ref >39)
HDL PARTICLE NUMBER: 28.8 umol/L — AB (ref >=30.5)
LDL Particle Number: 1041 nmol/L — ABNORMAL HIGH (ref <1000)
LDL SIZE: 20.5 nm (ref >20.5)
LDLC SERPL CALC-MCNC: 81 mg/dL (ref 0–99)
LP-IR SCORE: 49 — AB (ref <=45)
SMALL LDL PARTICLE NUMBER: 448 nmol/L (ref <=527)
Triglycerides by NMR: 72 mg/dL (ref 0–149)

## 2014-05-20 LAB — BMP8+EGFR
BUN/Creatinine Ratio: 19 (ref 9–20)
BUN: 18 mg/dL (ref 6–24)
CO2: 24 mmol/L (ref 18–29)
Calcium: 9.4 mg/dL (ref 8.7–10.2)
Chloride: 100 mmol/L (ref 97–108)
Creatinine, Ser: 0.95 mg/dL (ref 0.76–1.27)
GFR, EST AFRICAN AMERICAN: 102 mL/min/{1.73_m2} (ref >59)
GFR, EST NON AFRICAN AMERICAN: 88 mL/min/{1.73_m2} (ref >59)
Glucose: 87 mg/dL (ref 65–99)
Potassium: 4.4 mmol/L (ref 3.5–5.2)
Sodium: 141 mmol/L (ref 134–144)

## 2014-05-20 LAB — THYROID PANEL WITH TSH
Free Thyroxine Index: 2.3 (ref 1.2–4.9)
T3 Uptake Ratio: 39 % (ref 24–39)
T4, Total: 5.9 ug/dL (ref 4.5–12.0)
TSH: 2.34 u[IU]/mL (ref 0.450–4.500)

## 2014-05-20 LAB — VITAMIN D 25 HYDROXY (VIT D DEFICIENCY, FRACTURES): Vit D, 25-Hydroxy: 49.4 ng/mL (ref 30.0–100.0)

## 2014-05-22 ENCOUNTER — Telehealth: Payer: Self-pay | Admitting: *Deleted

## 2014-05-22 ENCOUNTER — Other Ambulatory Visit: Payer: Self-pay | Admitting: Family Medicine

## 2014-05-22 NOTE — Telephone Encounter (Signed)
Pt notified of lab results Verbalizes understanding 

## 2014-05-22 NOTE — Telephone Encounter (Signed)
Message copied by Bearl Mulberry on Sat May 22, 2014 10:45 AM ------      Message from: Ernestina Penna      Created: Thu May 20, 2014  5:32 PM       The blood sugar is good at 87. The creatinine the most important kidney function test is within normal limits, all of the electrolytes including potassium are within normal      All liver function tests are within normal limit      The total LDL particle number is slightly elevated from the floor. He is now thousand 41. The triglycerides are good. The LDL C. is slightly elevated at 81--- continue current treatment and as aggressive therapeutic lifestyle changes it is possible      All thyroid function tests are within normal limit      The vitamin D level is good, continue current rate ------

## 2014-05-28 ENCOUNTER — Telehealth: Payer: Self-pay | Admitting: Family Medicine

## 2014-05-28 NOTE — Telephone Encounter (Signed)
[facility] to x-ray 

## 2014-06-10 ENCOUNTER — Telehealth: Payer: Self-pay | Admitting: Family Medicine

## 2014-06-15 MED ORDER — INSULIN NPH (HUMAN) (ISOPHANE) 100 UNIT/ML ~~LOC~~ SUSP: 90.0000 [IU] | SUBCUTANEOUS | Status: DC

## 2014-06-15 MED ORDER — RAMIPRIL 5 MG PO CAPS: 5.0000 mg | ORAL_CAPSULE | Freq: Every day | ORAL | Status: DC

## 2014-06-15 NOTE — Telephone Encounter (Signed)
done

## 2014-06-16 ENCOUNTER — Telehealth: Payer: Self-pay | Admitting: Family Medicine

## 2014-06-16 ENCOUNTER — Other Ambulatory Visit: Payer: Self-pay | Admitting: *Deleted

## 2014-06-16 MED ORDER — RAMIPRIL 5 MG PO CAPS: 5.0000 mg | ORAL_CAPSULE | Freq: Every day | ORAL | Status: DC

## 2014-06-16 NOTE — Telephone Encounter (Signed)
Sent meds to [facility] and a 2 week supply to [facility]. Patient notified

## 2014-06-17 ENCOUNTER — Other Ambulatory Visit: Payer: Self-pay | Admitting: *Deleted

## 2014-06-17 MED ORDER — INSULIN NPH (HUMAN) (ISOPHANE) 100 UNIT/ML ~~LOC~~ SUSP: 90.0000 [IU] | SUBCUTANEOUS | Status: DC

## 2014-06-18 ENCOUNTER — Other Ambulatory Visit: Payer: Self-pay | Admitting: *Deleted

## 2014-06-18 MED ORDER — INSULIN NPH (HUMAN) (ISOPHANE) 100 UNIT/ML ~~LOC~~ SUSP: 90.0000 [IU] | SUBCUTANEOUS | Status: DC

## 2014-07-07 ENCOUNTER — Telehealth: Payer: Self-pay | Admitting: Family Medicine

## 2014-07-07 NOTE — Telephone Encounter (Signed)
Told to call back if any question

## 2014-07-07 NOTE — Telephone Encounter (Signed)
Called and left message.

## 2014-09-09 ENCOUNTER — Encounter: Payer: Self-pay | Admitting: Family Medicine

## 2014-09-09 ENCOUNTER — Ambulatory Visit (INDEPENDENT_AMBULATORY_CARE_PROVIDER_SITE_OTHER): Payer: BC Managed Care – PPO | Admitting: Family Medicine

## 2014-09-09 VITALS — BP 129/71 | HR 88 | Temp 97.7°F | Ht 70.5 in | Wt 241.0 lb

## 2014-09-09 DIAGNOSIS — E039 Hypothyroidism, unspecified: Secondary | ICD-10-CM

## 2014-09-09 DIAGNOSIS — L739 Follicular disorder, unspecified: Secondary | ICD-10-CM

## 2014-09-09 DIAGNOSIS — Z0000 Encounter for general adult medical examination without abnormal findings: Secondary | ICD-10-CM

## 2014-09-09 DIAGNOSIS — I10 Essential (primary) hypertension: Secondary | ICD-10-CM

## 2014-09-09 DIAGNOSIS — T148 Other injury of unspecified body region: Secondary | ICD-10-CM

## 2014-09-09 DIAGNOSIS — E785 Hyperlipidemia, unspecified: Secondary | ICD-10-CM

## 2014-09-09 DIAGNOSIS — E118 Type 2 diabetes mellitus with unspecified complications: Secondary | ICD-10-CM

## 2014-09-09 DIAGNOSIS — W57XXXA Bitten or stung by nonvenomous insect and other nonvenomous arthropods, initial encounter: Secondary | ICD-10-CM

## 2014-09-09 DIAGNOSIS — N400 Enlarged prostate without lower urinary tract symptoms: Secondary | ICD-10-CM

## 2014-09-09 DIAGNOSIS — E559 Vitamin D deficiency, unspecified: Secondary | ICD-10-CM

## 2014-09-09 LAB — POCT CBC
GRANULOCYTE PERCENT: 77.2 % (ref 37–80)
HEMATOCRIT: 48 % (ref 43.5–53.7)
HEMOGLOBIN: 15.2 g/dL (ref 14.1–18.1)
Lymph, poc: 1.4 (ref 0.6–3.4)
MCH, POC: 27.3 pg (ref 27–31.2)
MCHC: 31.7 g/dL — AB (ref 31.8–35.4)
MCV: 86.1 fL (ref 80–97)
MPV: 7.4 fL (ref 0–99.8)
POC GRANULOCYTE: 6.4 (ref 2–6.9)
POC LYMPH PERCENT: 16.9 %L (ref 10–50)
Platelet Count, POC: 261 10*3/uL (ref 142–424)
RBC: 5.6 M/uL (ref 4.69–6.13)
RDW, POC: 12.5 %
WBC: 8.3 10*3/uL (ref 4.6–10.2)

## 2014-09-09 LAB — POCT UA - MICROSCOPIC ONLY
Bacteria, U Microscopic: NEGATIVE
CASTS, UR, LPF, POC: NEGATIVE
CRYSTALS, UR, HPF, POC: NEGATIVE
Mucus, UA: NEGATIVE
RBC, urine, microscopic: NEGATIVE
Yeast, UA: NEGATIVE

## 2014-09-09 LAB — POCT URINALYSIS DIPSTICK
BILIRUBIN UA: NEGATIVE
Glucose, UA: 5
Leukocytes, UA: NEGATIVE
Nitrite, UA: NEGATIVE
Protein, UA: NEGATIVE
RBC UA: NEGATIVE
Spec Grav, UA: 1.025
Urobilinogen, UA: NEGATIVE
pH, UA: 5

## 2014-09-09 LAB — POCT GLYCOSYLATED HEMOGLOBIN (HGB A1C): Hemoglobin A1C: 7.9

## 2014-09-09 LAB — GLUCOSE, POCT (MANUAL RESULT ENTRY): POC GLUCOSE: 151 mg/dL — AB (ref 70–99)

## 2014-09-09 LAB — POCT UA - MICROALBUMIN: Microalbumin Ur, POC: 20 mg/L

## 2014-09-09 MED ORDER — CEPHALEXIN 500 MG PO CAPS: 500.0000 mg | ORAL_CAPSULE | Freq: Three times a day (TID) | ORAL | Status: DC

## 2014-09-09 MED ORDER — NIACIN ER (ANTIHYPERLIPIDEMIC) 500 MG PO TBCR: 1500.0000 mg | EXTENDED_RELEASE_TABLET | Freq: Every day | ORAL | Status: DC

## 2014-09-09 MED ORDER — ATORVASTATIN CALCIUM 80 MG PO TABS
80.0000 mg | ORAL_TABLET | Freq: Every day | ORAL | Status: DC
Start: 2014-09-09 — End: 2015-10-04

## 2014-09-09 MED ORDER — INSULIN NPH (HUMAN) (ISOPHANE) 100 UNIT/ML ~~LOC~~ SUSP: 90.0000 [IU] | SUBCUTANEOUS | Status: DC

## 2014-09-09 MED ORDER — INSULIN REGULAR HUMAN 100 UNIT/ML IJ SOLN: 10.0000 [IU] | Freq: Two times a day (BID) | INTRAMUSCULAR | Status: DC

## 2014-09-09 NOTE — Addendum Note (Signed)
Addended by: Prescott GumLAND, Nyesha Cliff M on: 09/09/2014 10:44 AM   Modules accepted: Orders

## 2014-09-09 NOTE — Progress Notes (Signed)
Subjective:    Patient ID: Joseph Kirby, male    DOB: [birth year], 58 y.o.   MRN: [id number]  HPI Patient is here today for annual wellness exam and follow up of chronic medical problems. The patient is doing well overall. He does have a sore place on his bottom which she would like for me to check. This is been there for about a week. He said it started as a small bump. He has had a recent cold with some chest congestion but says this is getting better. He sees the endocrinologist about every 4 months and his last hemoglobin A1c was around 7.2-7.3%. He does see an endocrinologist regularly. This is because of his difficult to control and manage diabetes. He is due to a rectal exam today. He will also get lab work. He is requesting refills on his insulin, Niaspan, and Lipitor. He also describes a recent tick bite on the right side of his abdomen. He indicates that his fasting blood sugars been running around 80. He is due to get an eye exam.          Patient Active Problem List   Diagnosis Date Noted  . Diabetes, insulin-dependent 06/20/2012  . CAD (coronary artery disease)   . Hypertension   . Hypothyroidism   . Ejection fraction   . DKA (diabetic ketoacidoses), history of 03/17/2012  . BPH (benign prostatic hyperplasia)   . Colon polyp   . DYSLIPIDEMIA 03/27/2010   Outpatient Encounter Prescriptions as of 09/09/2014  Medication Sig  . aspirin 81 MG tablet Take 81 mg by mouth daily.    Marland Kitchen atorvastatin (LIPITOR) 80 MG tablet Take 1 tablet (80 mg total) by mouth daily.  . Cholecalciferol (VITAMIN D) 2000 UNITS CAPS Take 2 capsules by mouth daily.  Marland Kitchen glucose blood (ONE TOUCH ULTRA TEST) test strip FSBS bid  . insulin NPH Human (HUMULIN N) 100 UNIT/ML injection Inject 0.9 mLs (90 Units total) into the skin as directed.  . insulin regular (HUMULIN R) 100 units/mL injection Inject 0.1 mLs (10 Units total) into the skin 2 (two) times daily before a meal.  . INSULIN SYRINGE 1CC/29G 29G X  1/2" 1 ML MISC 1 each by Does not apply route 3 (three) times daily.  . Lancets (ONETOUCH ULTRASOFT) lancets Use as instructed  . levothyroxine (SYNTHROID, LEVOTHROID) 175 MCG tablet Take 1 tablet (175 mcg total) by mouth daily. None on Sundays  . meloxicam (MOBIC) 15 MG tablet Take 0.5 tablets (7.5 mg total) by mouth daily.  . niacin (NIASPAN) 500 MG CR tablet Take 3 tablets (1,500 mg total) by mouth at bedtime.  Marland Kitchen nystatin-triamcinolone (MYCOLOG II) cream Apply 1 application topically 2 (two) times daily.  Marland Kitchen omega-3 acid ethyl esters (LOVAZA) 1 G capsule Take 2 capsules (2 g total) by mouth 2 (two) times daily.  . ramipril (ALTACE) 5 MG capsule Take 1 capsule (5 mg total) by mouth daily.  . ranitidine (ZANTAC) 150 MG tablet Take 150 mg by mouth 2 (two) times daily.    Review of Systems  Constitutional: Negative.   HENT: Negative.   Eyes: Negative.   Respiratory: Negative.   Cardiovascular: Negative.   Gastrointestinal: Negative.   Endocrine: Negative.   Genitourinary: Negative.   Musculoskeletal: Negative.   Skin: Negative.        Sore place on bottom  Allergic/Immunologic: Negative.   Neurological: Negative.   Hematological: Negative.   Psychiatric/Behavioral: Negative.        Objective:   Physical Exam  Constitutional: He is oriented to person, place, and time. He appears well-developed and well-nourished. No distress.  The patient is relaxed and alert.  HENT:  Head: Normocephalic and atraumatic.  Right Ear: External ear normal.  Left Ear: External ear normal.  Nose: Nose normal.  Mouth/Throat: Oropharynx is clear and moist. No oropharyngeal exudate.  There is slight nasal congestion on the right side. The mouth and throat were normal and there is no anterior cervical adenopathy.  Eyes: Conjunctivae and EOM are normal. Pupils are equal, round, and reactive to light. Right eye exhibits no discharge. Left eye exhibits no discharge. No scleral icterus.  Neck: Normal range  of motion. Neck supple. No thyromegaly present.  No carotid bruits  Cardiovascular: Normal rate, regular rhythm, normal heart sounds and intact distal pulses.  Exam reveals no gallop and no friction rub.   No murmur heard. At 84/m  Pulmonary/Chest: Effort normal and breath sounds normal. No respiratory distress. He has no wheezes. He has no rales. He exhibits no tenderness.  The lungs were clear anteriorly and posteriorly  Abdominal: Soft. Bowel sounds are normal. He exhibits no mass. There is no tenderness. There is no rebound and no guarding.  The abdomen is obese without masses or tenderness  Genitourinary: Rectum normal and penis normal.  The prostate was slightly enlarged but smooth in texture. There were no rectal masses. The external genitalia were normal. There are no inguinal hernias or inguinal adenopathy. It was noted after doing the rectal exam that there was a pink discoloration on my glove and the patient will be sent home with an FOBT to return.  Musculoskeletal: Normal range of motion. He exhibits no edema or tenderness.  Lymphadenopathy:    He has no cervical adenopathy.  Neurological: He is alert and oriented to person, place, and time. He has normal reflexes. No cranial nerve deficit.  Skin: Skin is warm and dry. No rash noted. No erythema. No pallor.  Psychiatric: He has a normal mood and affect. His behavior is normal. Judgment and thought content normal.  The patient has a very calm and quiet demeanor as usual. He works at a [employer] and will not return to work until March and he enjoys this job. Is only 12 minutes from his home.  Nursing note and vitals reviewed.  BP 129/71 mmHg  Pulse 88  Temp(Src) 97.7 F (36.5 C) (Oral)  Ht 5' 10.5" (1.791 m)  Wt 241 lb (109.317 kg)  BMI 34.08 kg/m2        Assessment & Plan:  1. Type 2 diabetes mellitus with complication - POCT CBC - POCT glycosylated hemoglobin (Hb A1C) - POCT UA - Microalbumin  2. Hypothyroidism,  unspecified hypothyroidism type - POCT CBC - Thyroid Panel With TSH  3. Essential hypertension - POCT CBC - BMP8+EGFR - Hepatic function panel  4. BPH (benign prostatic hyperplasia) - POCT CBC - POCT UA - Microscopic Only - POCT urinalysis dipstick - PSA, total and free  5. Vitamin D deficiency - POCT CBC - Vit D  25 hydroxy (rtn osteoporosis monitoring)  6. Hyperlipidemia - POCT CBC - NMR, lipoprofile  7. Annual physical exam - POCT CBC - POCT glycosylated hemoglobin (Hb A1C) - POCT UA - Microalbumin - POCT UA - Microscopic Only - POCT urinalysis dipstick - BMP8+EGFR - Hepatic function panel - NMR, lipoprofile - PSA, total and free - Vit D  25 hydroxy (rtn osteoporosis monitoring) - Thyroid Panel With TSH  8. Tick bite - Lyme Ab/Western  Blot Reflex - Rocky mtn spotted fvr abs pnl(IgG+IgM)  9. Perineal abscess -Prescription for Keflex 500  3 times daily for 10 days -Recheck this small area of cellulitis or small abscess in 2 weeks.  Meds ordered this encounter  Medications  . atorvastatin (LIPITOR) 80 MG tablet    Sig: Take 1 tablet (80 mg total) by mouth daily.    Dispense:  90 tablet    Refill:  3  . insulin regular (HUMULIN R) 100 units/mL injection    Sig: Inject 0.1 mLs (10 Units total) into the skin 2 (two) times daily before a meal.    Dispense:  30 mL    Refill:  3  . insulin NPH Human (HUMULIN N) 100 UNIT/ML injection    Sig: Inject 0.9 mLs (90 Units total) into the skin as directed.    Dispense:  90 mL    Refill:  3    50 UNITS am  40 UNITS pm  . niacin (NIASPAN) 500 MG CR tablet    Sig: Take 3 tablets (1,500 mg total) by mouth at bedtime.    Dispense:  270 tablet    Refill:  3  . DISCONTD: cephALEXin (KEFLEX) 500 MG capsule    Sig: Take 1 capsule (500 mg total) by mouth 3 (three) times daily.    Dispense:  30 capsule    Refill:  0  . cephALEXin (KEFLEX) 500 MG capsule    Sig: Take 1 capsule (500 mg total) by mouth 3 (three) times daily.      Dispense:  30 capsule    Refill:  0   Patient Instructions  Continue current medications. Continue good therapeutic lifestyle changes which include good diet and exercise. Fall precautions discussed with patient. If an FOBT was given today- please return it to our front desk. If you are over 73 years old - you may need Prevnar 31 or the adult Pneumonia vaccine.  Flu Shots will be available at our office starting mid- September. Please call and schedule a FLU CLINIC APPOINTMENT.   Clean the perineal abscess area with warm soapy water Take antibiotic as directed We will recheck this and review your lab work and a couple weeks. Stay as active as possible If you need any for cough and congestion Mucinex maximum strength, blue and white in color, 1 twice daily for cough and congestion with a large glass of water. Take Tylenol for aches and pains. Continue regular follow-up with endocrinologist   Arrie Senate MD

## 2014-09-09 NOTE — Addendum Note (Signed)
Addended by: Prescott GumLAND, Kijuan Gallicchio M on: 09/09/2014 11:34 AM   Modules accepted: Orders

## 2014-09-09 NOTE — Patient Instructions (Addendum)
Continue current medications. Continue good therapeutic lifestyle changes which include good diet and exercise. Fall precautions discussed with patient. If an FOBT was given today- please return it to our front desk. If you are over 57 years old - you may need Prevnar 13 or the adult Pneumonia vaccine.  Flu Shots will be available at our office starting mid- September. Please call and schedule a FLU CLINIC APPOINTMENT.   Clean the perineal abscess area with warm soapy water Take antibiotic as directed We will recheck this and review your lab work and a couple weeks. Stay as active as possible If you need any for cough and congestion Mucinex maximum strength, blue and white in color, 1 twice daily for cough and congestion with a large glass of water. Take Tylenol for aches and pains. Continue regular follow-up with endocrinologist

## 2014-09-10 LAB — MICROALBUMIN, URINE: Microalbumin, Urine: 6.5 ug/mL (ref 0.0–17.0)

## 2014-09-13 LAB — BMP8+EGFR
BUN/Creatinine Ratio: 17 (ref 9–20)
BUN: 17 mg/dL (ref 6–24)
CHLORIDE: 98 mmol/L (ref 97–108)
CO2: 24 mmol/L (ref 18–29)
CREATININE: 0.98 mg/dL (ref 0.76–1.27)
Calcium: 9.4 mg/dL (ref 8.7–10.2)
GFR calc Af Amer: 99 mL/min/{1.73_m2} (ref >59)
GFR calc non Af Amer: 85 mL/min/{1.73_m2} (ref >59)
GLUCOSE: 188 mg/dL — AB (ref 65–99)
Potassium: 4.4 mmol/L (ref 3.5–5.2)
Sodium: 138 mmol/L (ref 134–144)

## 2014-09-13 LAB — PSA, TOTAL AND FREE
PSA FREE: 0.12 ng/mL
PSA, Free Pct: 30 %
PSA: 0.4 ng/mL (ref 0.0–4.0)

## 2014-09-13 LAB — THYROID PANEL WITH TSH
Free Thyroxine Index: 4 (ref 1.2–4.9)
T3 UPTAKE RATIO: 44 % — AB (ref 24–39)
T4 TOTAL: 9 ug/dL (ref 4.5–12.0)
TSH: 2.56 u[IU]/mL (ref 0.450–4.500)

## 2014-09-13 LAB — NMR, LIPOPROFILE
CHOLESTEROL: 158 mg/dL (ref 100–199)
HDL Cholesterol by NMR: 45 mg/dL (ref >39)
HDL PARTICLE NUMBER: 26.9 umol/L — AB (ref >=30.5)
LDL Particle Number: 1149 nmol/L — ABNORMAL HIGH (ref <1000)
LDL Size: 20.9 nm (ref >20.5)
LDL-C: 98 mg/dL (ref 0–99)
LP-IR SCORE: 27 (ref <=45)
Small LDL Particle Number: 516 nmol/L (ref <=527)
Triglycerides by NMR: 77 mg/dL (ref 0–149)

## 2014-09-13 LAB — HEPATIC FUNCTION PANEL
ALT: 23 IU/L (ref 0–44)
AST: 22 IU/L (ref 0–40)
Albumin: 3.9 g/dL (ref 3.5–5.5)
Alkaline Phosphatase: 125 IU/L — ABNORMAL HIGH (ref 39–117)
Bilirubin, Direct: 0.28 mg/dL (ref 0.00–0.40)
TOTAL PROTEIN: 6.3 g/dL (ref 6.0–8.5)
Total Bilirubin: 0.8 mg/dL (ref 0.0–1.2)

## 2014-09-13 LAB — ROCKY MTN SPOTTED FVR ABS PNL(IGG+IGM)
RMSF IGM: 0.42 {index} (ref 0.00–0.89)
RMSF IgG: NEGATIVE

## 2014-09-13 LAB — VITAMIN D 25 HYDROXY (VIT D DEFICIENCY, FRACTURES): VIT D 25 HYDROXY: 43.6 ng/mL (ref 30.0–100.0)

## 2014-09-13 LAB — LYME AB/WESTERN BLOT REFLEX: LYME DISEASE AB, QUANT, IGM: <0.8 index (ref 0.00–0.79)

## 2014-09-22 ENCOUNTER — Other Ambulatory Visit: Payer: Self-pay

## 2014-09-22 DIAGNOSIS — Z1212 Encounter for screening for malignant neoplasm of rectum: Secondary | ICD-10-CM

## 2014-09-22 NOTE — Progress Notes (Signed)
Lab only 

## 2014-09-23 ENCOUNTER — Ambulatory Visit: Payer: BC Managed Care – PPO | Admitting: Family Medicine

## 2014-09-24 LAB — FECAL OCCULT BLOOD, IMMUNOCHEMICAL: FECAL OCCULT BLD: NEGATIVE

## 2014-09-27 ENCOUNTER — Ambulatory Visit (INDEPENDENT_AMBULATORY_CARE_PROVIDER_SITE_OTHER): Payer: BLUE CROSS/BLUE SHIELD | Admitting: Family Medicine

## 2014-09-27 ENCOUNTER — Encounter: Payer: Self-pay | Admitting: Family Medicine

## 2014-09-27 VITALS — BP 112/67 | HR 67 | Temp 98.2°F | Ht 70.5 in | Wt 241.0 lb

## 2014-09-27 DIAGNOSIS — L739 Follicular disorder, unspecified: Secondary | ICD-10-CM

## 2014-09-27 NOTE — Progress Notes (Signed)
Subjective:    Patient ID: Joseph Kirby, male    DOB: 1956/03/18, 59 y.o.   MRN: [id number]  HPI Patient here today for 2 week follow up on folliculitis. He states the problem is better.         Patient Active Problem List   Diagnosis Date Noted  . Diabetes, insulin-dependent 06/20/2012  . CAD (coronary artery disease)   . Hypertension   . Hypothyroidism   . Ejection fraction   . DKA (diabetic ketoacidoses), history of 03/17/2012  . BPH (benign prostatic hyperplasia)   . Colon polyp   . DYSLIPIDEMIA 03/27/2010   Outpatient Encounter Prescriptions as of 09/27/2014  Medication Sig  . aspirin 81 MG tablet Take 81 mg by mouth daily.    Marland Kitchen. atorvastatin (LIPITOR) 80 MG tablet Take 1 tablet (80 mg total) by mouth daily.  . Cholecalciferol (VITAMIN D) 2000 UNITS CAPS Take 2 capsules by mouth daily.  Marland Kitchen. glucose blood (ONE TOUCH ULTRA TEST) test strip FSBS bid  . insulin NPH Human (HUMULIN N) 100 UNIT/ML injection Inject 0.9 mLs (90 Units total) into the skin as directed.  . insulin regular (HUMULIN R) 100 units/mL injection Inject 0.1 mLs (10 Units total) into the skin 2 (two) times daily before a meal.  . INSULIN SYRINGE 1CC/29G 29G X 1/2" 1 ML MISC 1 each by Does not apply route 3 (three) times daily.  . Lancets (ONETOUCH ULTRASOFT) lancets Use as instructed  . levothyroxine (SYNTHROID, LEVOTHROID) 175 MCG tablet Take 1 tablet (175 mcg total) by mouth daily. None on Sundays  . meloxicam (MOBIC) 15 MG tablet Take 0.5 tablets (7.5 mg total) by mouth daily.  . niacin (NIASPAN) 500 MG CR tablet Take 3 tablets (1,500 mg total) by mouth at bedtime.  Marland Kitchen. nystatin-triamcinolone (MYCOLOG II) cream Apply 1 application topically 2 (two) times daily.  Marland Kitchen. omega-3 acid ethyl esters (LOVAZA) 1 G capsule Take 2 capsules (2 g total) by mouth 2 (two) times daily.  . ramipril (ALTACE) 5 MG capsule Take 1 capsule (5 mg total) by mouth daily.  . ranitidine (ZANTAC) 150 MG tablet Take 150 mg by mouth 2 (two)  times daily.  . [DISCONTINUED] cephALEXin (KEFLEX) 500 MG capsule Take 1 capsule (500 mg total) by mouth 3 (three) times daily.    Review of Systems  Constitutional: Negative.   HENT: Negative.   Eyes: Negative.   Respiratory: Negative.   Cardiovascular: Negative.   Gastrointestinal: Negative.   Endocrine: Negative.   Genitourinary: Negative.   Musculoskeletal: Negative.   Skin: Negative.   Allergic/Immunologic: Negative.   Neurological: Negative.   Hematological: Negative.   Psychiatric/Behavioral: Negative.        Objective:   Physical Exam  Constitutional: He is oriented to person, place, and time. He appears well-developed and well-nourished. No distress.  Genitourinary:  There is a small tiny opening the size of an eraser on the perineal area without drainage. This is persistent from the last visit despite taking the Keflex.  Musculoskeletal: Normal range of motion.  Neurological: He is alert and oriented to person, place, and time.  Skin: Skin is warm and dry. No rash noted. There is erythema. No pallor.  Psychiatric: He has a normal mood and affect. His behavior is normal. Judgment and thought content normal.  Nursing note and vitals reviewed.  BP 112/67 mmHg  Pulse 67  Temp(Src) 98.2 F (36.8 C) (Oral)  Ht 5' 10.5" (1.791 m)  Wt 241 lb (109.317 kg)  BMI 34.08 kg/m2  Assessment & Plan:  1.  pilonidal cyst/abcess - Ambulatory referral to General Surgery  Patient Instructions   Because the opening persists on the perineum you'll have to be referred to general surgeon to see if this could be a fissure that is opened up as a pilonidal cyst.  continue soaking and we will arrange this appointment as soon as possible   Nyra Capes MD

## 2014-09-27 NOTE — Patient Instructions (Signed)
Because the opening persists on the perineum you'll have to be referred to general surgeon to see if this could be a fissure that is opened up as a pilonidal cyst.  continue soaking and we will arrange this appointment as soon as possible

## 2014-10-12 ENCOUNTER — Encounter: Payer: Self-pay | Admitting: *Deleted

## 2014-11-08 ENCOUNTER — Telehealth: Payer: Self-pay | Admitting: Family Medicine

## 2014-11-08 NOTE — Telephone Encounter (Signed)
Pt notified he [facility] appt scheduled

## 2014-11-08 NOTE — Telephone Encounter (Signed)
Appointment given for tomorrow with Joseph Kirby

## 2014-11-08 NOTE — Telephone Encounter (Signed)
Please review and advise.

## 2014-11-08 NOTE — Telephone Encounter (Signed)
Yes you should be seen

## 2014-11-09 ENCOUNTER — Ambulatory Visit (INDEPENDENT_AMBULATORY_CARE_PROVIDER_SITE_OTHER): Payer: BLUE CROSS/BLUE SHIELD | Admitting: Family

## 2014-11-09 ENCOUNTER — Encounter: Payer: Self-pay | Admitting: Family

## 2014-11-09 ENCOUNTER — Encounter (INDEPENDENT_AMBULATORY_CARE_PROVIDER_SITE_OTHER): Payer: Self-pay

## 2014-11-09 VITALS — BP 123/74 | HR 83 | Temp 97.3°F | Ht 70.5 in | Wt 242.2 lb

## 2014-11-09 DIAGNOSIS — M7989 Other specified soft tissue disorders: Secondary | ICD-10-CM

## 2014-11-09 NOTE — Progress Notes (Signed)
   Subjective:    Patient ID: Joseph Kirby, male    DOB: 1955/11/13, 59 y.o.   MRN: [id number]  HPI Pt presents to the office for right foot swelling. Pt states he noticed it Friday. Pt states it is unchanged and does not cause him any pain. Pt states he "soaked it" with no relief. Pt currently not taking a diuretic.    Review of Systems  Constitutional: Negative.   HENT: Negative.   Respiratory: Negative.   Cardiovascular: Negative.   Gastrointestinal: Negative.   Endocrine: Negative.   Genitourinary: Negative.   Musculoskeletal: Negative.   Neurological: Negative.   Hematological: Negative.   Psychiatric/Behavioral: Negative.   All other systems reviewed and are negative.      Objective:   Physical Exam  Constitutional: He is oriented to person, place, and time. He appears well-developed and well-nourished. No distress.  Eyes: Pupils are equal, round, and reactive to light. Right eye exhibits no discharge. Left eye exhibits no discharge.  Neck: Normal range of motion. Neck supple. No thyromegaly present.  Cardiovascular: Normal rate, regular rhythm, normal heart sounds and intact distal pulses.   No murmur heard. Pulmonary/Chest: Effort normal and breath sounds normal. No respiratory distress. He has no wheezes.  Abdominal: Soft. Bowel sounds are normal. He exhibits no distension. There is no tenderness.  Musculoskeletal: Normal range of motion. He exhibits edema (trace amt in right foot). He exhibits no tenderness.  Neurological: He is alert and oriented to person, place, and time. He has normal reflexes. No cranial nerve deficit.  Skin: Skin is warm and dry. No rash noted. No erythema.  Psychiatric: He has a normal mood and affect. His behavior is normal. Judgment and thought content normal.  Vitals reviewed.    BP 123/74 mmHg  Pulse 83  Temp(Src) 97.3 F (36.3 C) (Oral)  Ht 5' 10.5" (1.791 m)  Wt 242 lb 3.2 oz (109.861 kg)  BMI 34.25 kg/m2      Assessment & Plan:   1. Foot swelling -Rest -Elevation as needed -RTO prn - Lower Extremity Venous Duplex Right; Future - Brain natriuretic peptide - CMP14+EGFR - D-dimer, quantitative  Evelina Dun, FNP

## 2014-11-09 NOTE — Patient Instructions (Addendum)
Peripheral Edema °You have swelling in your legs (peripheral edema). This swelling is due to excess accumulation of salt and water in your body. Edema may be a sign of heart, kidney or liver disease, or a side effect of a medication. It may also be due to problems in the leg veins. Elevating your legs and using special support stockings may be very helpful, if the cause of the swelling is due to poor venous circulation. Avoid long periods of standing, whatever the cause. °Treatment of edema depends on identifying the cause. Chips, pretzels, pickles and other salty foods should be avoided. Restricting salt in your diet is almost always needed. Water pills (diuretics) are often used to remove the excess salt and water from your body via urine. These medicines prevent the kidney from reabsorbing sodium. This increases urine flow. °Diuretic treatment may also result in lowering of potassium levels in your body. Potassium supplements may be needed if you have to use diuretics daily. Daily weights can help you keep track of your progress in clearing your edema. You should call your caregiver for follow up care as recommended. °SEEK IMMEDIATE MEDICAL CARE IF:  °· You have increased swelling, pain, redness, or heat in your legs. °· You develop shortness of breath, especially when lying down. °· You develop chest or abdominal pain, weakness, or fainting. °· You have a fever. °Document Released: 10/04/2004 Document Revised: 11/19/2011 Document Reviewed: 09/14/2009 °ExitCare® Patient Information ©2015 [employer]. This information is not intended to replace advice given to you by your health care provider. Make sure you discuss any questions you have with your health care provider. ° ° ° °Deep Vein Thrombosis °A deep vein thrombosis (DVT) is a blood clot that develops in the deep, larger veins of the leg, arm, or pelvis. These are more dangerous than clots that might form in veins near the surface of the body. A DVT can lead  to serious and even life-threatening complications if the clot breaks off and travels in the bloodstream to the lungs.  °A DVT can damage the valves in your leg veins so that instead of flowing upward, the blood pools in the lower leg. This is called post-thrombotic syndrome, and it can result in pain, swelling, discoloration, and sores on the leg. °CAUSES °Usually, several things contribute to the formation of blood clots. Contributing factors include: °· The flow of blood slows down. °· The inside of the vein is damaged in some way. °· You have a condition that makes blood clot more easily. °RISK FACTORS °Some people are more likely than others to develop blood clots. Risk factors include:  °· Smoking. °· Being overweight (obese). °· Sitting or lying still for a long time. This includes long-distance travel, paralysis, or recovery from an illness or surgery. °Other factors that increase risk are:  °· Older age, especially over 75 years of age. °· Having a family history of blood clots or if you have already had a blot clot. °· Having major or lengthy surgery. This is especially true for surgery on the hip, knee, or belly (abdomen). Hip surgery is particularly high risk. °· Having a long, thin tube (catheter) placed inside a vein during a medical procedure. °· Breaking a hip or leg. °· Having cancer or cancer treatment. °· Pregnancy and childbirth. °¨ Hormone changes make the blood clot more easily during pregnancy. °¨ The fetus puts pressure on the veins of the pelvis. °¨ There is a risk of injury to veins during delivery or   a caesarean delivery. The risk is highest just after childbirth. °· Medicines containing the male hormone estrogen. This includes birth control pills and hormone replacement therapy. °· Other circulation or heart problems. ° °SIGNS AND SYMPTOMS °When a clot forms, it can either partially or totally block the blood flow in that vein. Symptoms of a DVT can include: °· Swelling of the leg or  arm, especially if one side is much worse. °· Warmth and redness of the leg or arm, especially if one side is much worse. °· Pain in an arm or leg. If the clot is in the leg, symptoms may be more noticeable or worse when standing or walking. °The symptoms of a DVT that has traveled to the lungs (pulmonary embolism, PE) usually start suddenly and include: °· Shortness of breath. °· Coughing. °· Coughing up blood or blood-tinged mucus. °· Chest pain. The chest pain is often worse with deep breaths. °· Rapid heartbeat. °Anyone with these symptoms should get emergency medical treatment right away. Do not wait to see if the symptoms will go away. Call your local emergency services (911 in the [place] if you have these symptoms. Do not drive yourself to the hospital. °DIAGNOSIS °If a DVT is suspected, your health care provider will take a full medical history and perform a physical exam. Tests that also may be required include: °· Blood tests, including studies of the clotting properties of the blood. °· Ultrasound to see if you have clots in your legs or lungs. °· X-rays to show the flow of blood when dye is injected into the veins (venogram). °· Studies of your lungs if you have any chest symptoms. °PREVENTION °· Exercise the legs regularly. Take a brisk 30-minute walk every day. °· Maintain a weight that is appropriate for your height. °· Avoid sitting or lying in bed for long periods of time without moving your legs. °· Women, particularly those over the age of 35 years, should consider the risks and benefits of taking estrogen medicines, including birth control pills. °· Do not smoke, especially if you take estrogen medicines. °· Long-distance travel can increase your risk of DVT. You should exercise your legs by walking or pumping the muscles every hour. °· Many of the risk factors above relate to situations that exist with hospitalization, either for illness, injury, or elective surgery. Prevention may include  medical and nonmedical measures. °¨ Your health care provider will assess you for the need for venous thromboembolism prevention when you are admitted to the hospital. If you are having surgery, your surgeon will assess you the day of or day after surgery. °TREATMENT °Once identified, a DVT can be treated. It can also be prevented in some circumstances. Once you have had a DVT, you may be at increased risk for a DVT in the future. The most common treatment for DVT is blood-thinning (anticoagulant) medicine, which reduces the blood's tendency to clot. Anticoagulants can stop new blood clots from forming and stop old clots from growing. They cannot dissolve existing clots. Your body does this by itself over time. Anticoagulants can be given by mouth, through an IV tube, or by injection. Your health care provider will determine the best program for you. Other medicines or treatments that may be used are: °· Heparin or related medicines (low molecular weight heparin) are often the first treatment for a blood clot. They act quickly. However, they cannot be taken orally and must be given either in shot form or by IV tube. °¨ Heparin   can cause a fall in a component of blood that stops bleeding and forms blood clots (platelets). You will be monitored with blood tests to be sure this does not occur. °· Warfarin is an anticoagulant that can be swallowed. It takes a few days to start working, so usually heparin or related medicines are used in combination. Once warfarin is working, heparin is usually stopped. °· Factor Xa inhibitor medicines, such as rivaroxaban and apixaban, also reduce blood clotting. These medicines are taken orally and can often be used without heparin or related medicines. °· Less commonly, clot dissolving drugs (thrombolytics) are used to dissolve a DVT. They carry a high risk of bleeding, so they are used mainly in severe cases where your life or a part of your body is threatened. °· Very rarely, a  blood clot in the leg needs to be removed surgically. °· If you are unable to take anticoagulants, your health care provider may arrange for you to have a filter placed in a main vein in your abdomen. This filter prevents clots from traveling to your lungs. °HOME CARE INSTRUCTIONS °· Take all medicines as directed by your health care provider. °· Learn as much as you can about DVT. °· Wear a medical alert bracelet or carry a medical alert card. °· Ask your health care provider how soon you can go back to normal activities. It is important to stay active to prevent blood clots. If you are on anticoagulant medicine, avoid contact sports. °· It is very important to exercise. This is especially important while traveling, sitting, or standing for long periods of time. Exercise your legs by walking or by tightening and relaxing your leg muscles regularly. Take frequent walks. °· You may need to wear compression stockings. These are tight elastic stockings that apply pressure to the lower legs. This pressure can help keep the blood in the legs from clotting. °Taking Warfarin °Warfarin is a daily medicine that is taken by mouth. Your health care provider will advise you on the length of treatment (usually 3-6 months, sometimes lifelong). If you take warfarin: °· Understand how to take warfarin and foods that can affect how warfarin works in your body. °· Too much and too little warfarin are both dangerous. Too much warfarin increases the risk of bleeding. Too little warfarin continues to allow the risk for blood clots. °Warfarin and Regular Blood Testing °While taking warfarin, you will need to have regular blood tests to measure your blood clotting time. These blood tests usually include both the prothrombin time (PT) and international normalized ratio (INR) tests. The PT and INR results allow your health care provider to adjust your dose of warfarin. It is very important that you have your PT and INR tested as often as  directed by your health care provider.    °Warfarin and Your Diet °Avoid major changes in your diet, or notify your health care provider before changing your diet. Arrange a visit with a registered dietitian to answer your questions. Many foods, especially foods high in vitamin K, can interfere with warfarin and affect the PT and INR results. You should eat a consistent amount of foods high in vitamin K. Foods high in vitamin K include:  °· Spinach, kale, broccoli, cabbage, collard and turnip greens, Brussels sprouts, peas, cauliflower, seaweed, and parsley. °· Beef and pork liver. °· Green tea. °· Soybean oil. °Warfarin with Other Medicines °Many medicines can interfere with warfarin and affect the PT and INR results. You must: °· Tell your health   care provider about any and all medicines, vitamins, and supplements you take, including aspirin and other over-the-counter anti-inflammatory medicines. Be especially cautious with aspirin and anti-inflammatory medicines. Ask your health care provider before taking these. °· Do not take or discontinue any prescribed or over-the-counter medicine except on the advice of your health care provider or pharmacist. °Warfarin Side Effects °Warfarin can have side effects, such as easy bruising and difficulty stopping bleeding. Ask your health care provider or pharmacist about other side effects of warfarin. You will need to: °· Hold pressure over cuts for longer than usual. °· Notify your dentist and other health care providers that you are taking warfarin before you undergo any procedures where bleeding may occur. °Warfarin with Alcohol and Tobacco  °· Drinking alcohol frequently can increase the effect of warfarin, leading to excess bleeding. It is best to avoid alcoholic drinks or to consume only very small amounts while taking warfarin. Notify your health care provider if you change your alcohol intake.   °· Do not use any tobacco products including cigarettes, chewing  tobacco, or electronic cigarettes. If you smoke, quit. Ask your health care provider for help with quitting smoking. °Alternative Medicines to Warfarin: Factor Xa Inhibitor Medicines °· These blood-thinning medicines are taken by mouth, usually for several weeks or longer. It is important to take the medicine every single day at the same time each day. °· There are no regular blood tests required when using these medicines. °· There are fewer food and drug interactions than with warfarin. °· The side effects of this class of medicine are similar to those of warfarin, including excessive bruising or bleeding. Ask your health care provider or pharmacist about other potential side effects. °SEEK MEDICAL CARE IF: °· You notice a rapid heartbeat. °· You feel weaker or more tired than usual. °· You feel faint. °· You notice increased bruising. °· You feel your symptoms are not getting better in the time expected. °· You believe you are having side effects of medicine. °SEEK IMMEDIATE MEDICAL CARE IF: °· You have chest pain. °· You have trouble breathing. °· You have new or increased swelling or pain in one leg. °· You cough up blood. °· You notice blood in vomit, in a bowel movement, or in urine. °MAKE SURE YOU: °· Understand these instructions. °· Will watch your condition. °· Will get help right away if you are not doing well or get worse. °Document Released: 08/27/2005 Document Revised: 01/11/2014 Document Reviewed: 05/04/2013 °ExitCare® Patient Information ©2015 [employer]. This information is not intended to replace advice given to you by your health care provider. Make sure you discuss any questions you have with your health care provider. ° °

## 2014-11-10 LAB — CMP14+EGFR
ALT: 23 IU/L (ref 0–44)
AST: 24 IU/L (ref 0–40)
Albumin/Globulin Ratio: 2.1 (ref 1.1–2.5)
Albumin: 4.2 g/dL (ref 3.5–5.5)
Alkaline Phosphatase: 99 IU/L (ref 39–117)
BILIRUBIN TOTAL: 0.6 mg/dL (ref 0.0–1.2)
BUN/Creatinine Ratio: 14 (ref 9–20)
BUN: 14 mg/dL (ref 6–24)
CHLORIDE: 101 mmol/L (ref 97–108)
CO2: 25 mmol/L (ref 18–29)
Calcium: 9.4 mg/dL (ref 8.7–10.2)
Creatinine, Ser: 1.02 mg/dL (ref 0.76–1.27)
GFR calc Af Amer: 94 mL/min/{1.73_m2} (ref >59)
GFR calc non Af Amer: 81 mL/min/{1.73_m2} (ref >59)
Globulin, Total: 2 g/dL (ref 1.5–4.5)
Glucose: 313 mg/dL — ABNORMAL HIGH (ref 65–99)
Potassium: 4.6 mmol/L (ref 3.5–5.2)
Sodium: 139 mmol/L (ref 134–144)
TOTAL PROTEIN: 6.2 g/dL (ref 6.0–8.5)

## 2014-11-10 LAB — D-DIMER, QUANTITATIVE: D-DIMER: <0.2 mg/L FEU (ref 0.00–0.49)

## 2014-11-10 LAB — BRAIN NATRIURETIC PEPTIDE: BNP: 38.6 pg/mL (ref 0.0–100.0)

## 2014-12-28 ENCOUNTER — Other Ambulatory Visit: Payer: Self-pay | Admitting: Family Medicine

## 2015-01-20 ENCOUNTER — Ambulatory Visit: Payer: BC Managed Care – PPO | Admitting: Family Medicine

## 2015-01-21 ENCOUNTER — Ambulatory Visit: Payer: BLUE CROSS/BLUE SHIELD | Admitting: Family Medicine

## 2015-02-21 ENCOUNTER — Ambulatory Visit (INDEPENDENT_AMBULATORY_CARE_PROVIDER_SITE_OTHER): Payer: BLUE CROSS/BLUE SHIELD

## 2015-02-21 ENCOUNTER — Encounter: Payer: Self-pay | Admitting: Family Medicine

## 2015-02-21 ENCOUNTER — Ambulatory Visit (INDEPENDENT_AMBULATORY_CARE_PROVIDER_SITE_OTHER): Payer: BLUE CROSS/BLUE SHIELD | Admitting: Family Medicine

## 2015-02-21 ENCOUNTER — Encounter: Payer: Self-pay | Admitting: *Deleted

## 2015-02-21 VITALS — BP 109/66 | HR 92 | Temp 97.2°F | Ht 70.5 in | Wt 250.0 lb

## 2015-02-21 DIAGNOSIS — N400 Enlarged prostate without lower urinary tract symptoms: Secondary | ICD-10-CM | POA: Diagnosis not present

## 2015-02-21 DIAGNOSIS — E118 Type 2 diabetes mellitus with unspecified complications: Secondary | ICD-10-CM

## 2015-02-21 DIAGNOSIS — I10 Essential (primary) hypertension: Secondary | ICD-10-CM

## 2015-02-21 DIAGNOSIS — E785 Hyperlipidemia, unspecified: Secondary | ICD-10-CM | POA: Diagnosis not present

## 2015-02-21 DIAGNOSIS — M25562 Pain in left knee: Secondary | ICD-10-CM | POA: Diagnosis not present

## 2015-02-21 DIAGNOSIS — E039 Hypothyroidism, unspecified: Secondary | ICD-10-CM | POA: Diagnosis not present

## 2015-02-21 DIAGNOSIS — R609 Edema, unspecified: Secondary | ICD-10-CM

## 2015-02-21 DIAGNOSIS — S82002A Unspecified fracture of left patella, initial encounter for closed fracture: Secondary | ICD-10-CM | POA: Diagnosis not present

## 2015-02-21 DIAGNOSIS — E559 Vitamin D deficiency, unspecified: Secondary | ICD-10-CM | POA: Diagnosis not present

## 2015-02-21 MED ORDER — FUROSEMIDE 20 MG PO TABS: 20.0000 mg | ORAL_TABLET | Freq: Every day | ORAL | Status: DC

## 2015-02-21 MED ORDER — OMEGA-3-ACID ETHYL ESTERS 1 G PO CAPS: 2.0000 g | ORAL_CAPSULE | Freq: Two times a day (BID) | ORAL | Status: DC

## 2015-02-21 NOTE — Patient Instructions (Addendum)
Continue current medications. Continue good therapeutic lifestyle changes which include good diet and exercise. Fall precautions discussed with patient. If an FOBT was given today- please return it to our front desk. If you are over 58 years old - you may need Prevnar 13 or the adult Pneumonia vaccine.  Flu Shots are still available at our office. If you still haven't had one please call to set up a nurse visit to get one.   After your visit with [place] today you will receive a survey in the mail or online from [employer] regarding your care with [place]. Please take a moment to fill this out. Your feedback is very important to [place] as you can help [place] better understand your patient needs as well as improve your experience and satisfaction. WE CARE ABOUT YOU!!!   Keep appointment with endocrinologist for follow-up of diabetes Return to clinic in about 10 days to recheck the knee and edema in the left leg Take the Lasix daily in the morning for 1 week Elevate the leg as much as possible higher than the level of your heart

## 2015-02-21 NOTE — Progress Notes (Signed)
Subjective:    Patient ID: Joseph Kirby, male    DOB: 07/08/1956, 59 y.o.   MRN: [id number]  HPI Pt here for follow up and management of chronic medical problems which includes hypertension, hypothyroid, hyperlipidemia, and diabetes. He is taking medications regularly. Patient fell about a week and a half ago and has since had some problems with left lower leg pain and edema. The patient is up-to-date on his health maintenance issues other than getting a chest x-ray and getting his lab work which he will return for fasting. The patient has not seen his endocrinologist in a good while because she retired and a new person is taking her place. He has plans to follow-up with her again soon. Sugars been running in the 60-120 range. His last hemoglobin A1c, he says, was 7.6%. After the fall about a week and a half ago he developed much more pain in his left leg with more swelling in the left leg. He denies chest pain or shortness of breath or PND. No trouble with his stomach or urinary tract. He is working at [employer].      Patient Active Problem List   Diagnosis Date Noted  . Diabetes, insulin-dependent 06/20/2012  . CAD (coronary artery disease)   . Hypertension   . Hypothyroidism   . Ejection fraction   . DKA (diabetic ketoacidoses), history of 03/17/2012  . BPH (benign prostatic hyperplasia)   . Colon polyp   . DYSLIPIDEMIA 03/27/2010   Outpatient Encounter Prescriptions as of 02/21/2015  Medication Sig  . aspirin 81 MG tablet Take 81 mg by mouth daily.    Marland Kitchen atorvastatin (LIPITOR) 80 MG tablet Take 1 tablet (80 mg total) by mouth daily.  . Cholecalciferol (VITAMIN D) 2000 UNITS CAPS Take 2 capsules by mouth daily.  Marland Kitchen glucose blood (ONE TOUCH ULTRA TEST) test strip FSBS bid  . insulin NPH Human (HUMULIN N) 100 UNIT/ML injection Inject 0.9 mLs (90 Units total) into the skin as directed.  . insulin regular (HUMULIN R) 100 units/mL injection Inject 0.1 mLs (10 Units total) into the skin 2 (two)  times daily before a meal.  . INSULIN SYRINGE 1CC/29G 29G X 1/2" 1 ML MISC 1 each by Does not apply route 3 (three) times daily.  . Lancets (ONETOUCH ULTRASOFT) lancets Use as instructed  . levothyroxine (SYNTHROID, LEVOTHROID) 175 MCG tablet Take 1 tablet (175 mcg total) by mouth daily. None on Sundays  . niacin (NIASPAN) 500 MG CR tablet Take 3 tablets (1,500 mg total) by mouth at bedtime.  Marland Kitchen nystatin-triamcinolone (MYCOLOG II) cream Apply 1 application topically 2 (two) times daily.  Marland Kitchen omega-3 acid ethyl esters (LOVAZA) 1 G capsule Take 2 capsules (2 g total) by mouth 2 (two) times daily.  . ramipril (ALTACE) 5 MG capsule TAKE 1 CAPSULE DAILY  . ranitidine (ZANTAC) 150 MG tablet Take 150 mg by mouth 2 (two) times daily.  . [DISCONTINUED] meloxicam (MOBIC) 15 MG tablet Take 0.5 tablets (7.5 mg total) by mouth daily. (Patient not taking: Reported on 11/09/2014)   No facility-administered encounter medications on file as of 02/21/2015.      Review of Systems  Constitutional: Negative.   HENT: Negative.   Eyes: Negative.   Respiratory: Negative.   Cardiovascular: Negative.   Gastrointestinal: Negative.   Endocrine: Negative.   Genitourinary: Negative.   Musculoskeletal: Positive for arthralgias (left lower leg pain and swelling).  Skin: Negative.   Allergic/Immunologic: Negative.   Neurological: Negative.   Hematological: Negative.  Psychiatric/Behavioral: Negative.        Objective:   Physical Exam  Constitutional: He is oriented to person, place, and time. He appears well-developed and well-nourished. No distress.  HENT:  Head: Normocephalic and atraumatic.  Right Ear: External ear normal.  Left Ear: External ear normal.  Nose: Nose normal.  Mouth/Throat: Oropharynx is clear and moist. No oropharyngeal exudate.  Eyes: Conjunctivae and EOM are normal. Pupils are equal, round, and reactive to light. Right eye exhibits no discharge. Left eye exhibits no discharge. No scleral  icterus.  Neck: Normal range of motion. Neck supple. No thyromegaly present.  No carotid bruits  Cardiovascular: Normal rate, regular rhythm, normal heart sounds and intact distal pulses.   No murmur heard. At 84/m  Pulmonary/Chest: Effort normal and breath sounds normal. No respiratory distress. He has no wheezes. He has no rales. He exhibits no tenderness.  Clear anteriorly and posteriorly  Abdominal: Soft. Bowel sounds are normal. He exhibits no mass. There is no tenderness. There is no rebound and no guarding.  Abdomen is nontender without organ enlargement or mass  Musculoskeletal: Normal range of motion. He exhibits edema. He exhibits no tenderness.  There is edema bilaterally left leg greater than right leg and there is some tenderness at bilateral joint lines of the left knee. There is a large bruise or contusion which can be observed around the knee medially.  Lymphadenopathy:    He has no cervical adenopathy.  Neurological: He is alert and oriented to person, place, and time. He has normal reflexes. No cranial nerve deficit.  Skin: Skin is warm and dry. No rash noted. No erythema. No pallor.  Psychiatric: He has a normal mood and affect. His behavior is normal. Judgment and thought content normal.  Nursing note and vitals reviewed.  BP 109/66 mmHg  Pulse 92  Temp(Src) 97.2 F (36.2 C) (Oral)  Ht 5' 10.5" (1.791 m)  Wt 250 lb (113.399 kg)  BMI 35.35 kg/m2  [facility] reading (PRIMARY) by  Dr.Zilpha Mcandrew-chest x-ray and left knee  --- no active disease on chest film, left patella fracture                                    Assessment & Plan:  1. Type 2 diabetes mellitus with complication -Continue current treatment pending results of lab work - POCT CBC; Future - BMP8+EGFR; Future - POCT glycosylated hemoglobin (Hb A1C); Future  2. Hypothyroidism, unspecified hypothyroidism type -No change in treatment pending results of lab work - POCT CBC; Future - Thyroid Panel With TSH;  Future  3. Essential hypertension -The patient's blood pressure is under good control today. He should continue with current treatment - POCT CBC; Future - BMP8+EGFR; Future - Hepatic function panel; Future - DG Chest 2 View; Future  4. BPH (benign prostatic hyperplasia) -He has no complaints with his prostate or voiding issues. - POCT CBC; Future  5. Hyperlipidemia -He should continue with current treatment pending results of lab work to be done in the future. Current treatment includes atorvastatin and omega-3 fatty acids - POCT CBC; Future - NMR, lipoprofile; Future - DG Chest 2 View; Future  6. Vitamin D deficiency -Continue with vitamin D3 2000 units daily pending results of lab work - POCT CBC; Future - Vit D  25 hydroxy (rtn osteoporosis monitoring); Future  7. Left knee pain -The knee is somewhat swollen but the lower leg is more swollen  and we will get an x-ray today to further evaluate that -X-ray that the patient may have a left patella fracture and he will be referred to the orthopedic surgeon for further evaluation -He'll be given a note to remain out of work until this evaluation is complete and will be given a time to come back here in 7-10 days if not being seen by the orthopedist at that time - DG Knee 1-2 Views Left; Future  8. Edema -He will take Lasix daily for about 1 week and at the same time elevating his leg as much as possible above the level of his heart to help keep the fluid down - furosemide (LASIX) 20 MG tablet; Take 1 tablet (20 mg total) by mouth daily.  Dispense: 7 tablet; Refill: 0  Patient Instructions  Continue current medications. Continue good therapeutic lifestyle changes which include good diet and exercise. Fall precautions discussed with patient. If an FOBT was given today- please return it to our front desk. If you are over 74 years old - you may need Prevnar 79 or the adult Pneumonia vaccine.  Flu Shots are still available at our  office. If you still haven't had one please call to set up a nurse visit to get one.   After your visit with [place] today you will receive a survey in the mail or online from [employer] regarding your care with [place]. Please take a moment to fill this out. Your feedback is very important to [place] as you can help [place] better understand your patient needs as well as improve your experience and satisfaction. WE CARE ABOUT YOU!!!   Keep appointment with endocrinologist for follow-up of diabetes Return to clinic in about 10 days to recheck the knee and edema in the left leg Take the Lasix daily in the morning for 1 week Elevate the leg as much as possible higher than the level of your heart   referred to orthopedics -Remain out of work Arrie Senate MD

## 2015-02-22 ENCOUNTER — Telehealth: Payer: Self-pay

## 2015-02-22 LAB — BMP8+EGFR
BUN/Creatinine Ratio: 17 (ref 9–20)
BUN: 18 mg/dL (ref 6–24)
CHLORIDE: 103 mmol/L (ref 97–108)
CO2: 24 mmol/L (ref 18–29)
Calcium: 9.4 mg/dL (ref 8.7–10.2)
Creatinine, Ser: 1.04 mg/dL (ref 0.76–1.27)
GFR calc Af Amer: 91 mL/min/{1.73_m2} (ref >59)
GFR calc non Af Amer: 79 mL/min/{1.73_m2} (ref >59)
GLUCOSE: 46 mg/dL — AB (ref 65–99)
Potassium: 4.1 mmol/L (ref 3.5–5.2)
Sodium: 143 mmol/L (ref 134–144)

## 2015-02-22 NOTE — Telephone Encounter (Signed)
Insurance denied prior authorization for Omega 3 Acid because has to have had a triglyceride level of 150 or above

## 2015-02-22 NOTE — Telephone Encounter (Signed)
Please have patient take Joseph Kirby fish oil over-the-counter and have him take 2 daily. This can be purchased at [facility] or the drugstore in [place]

## 2015-03-04 ENCOUNTER — Encounter: Payer: Self-pay | Admitting: Family Medicine

## 2015-03-04 ENCOUNTER — Ambulatory Visit (INDEPENDENT_AMBULATORY_CARE_PROVIDER_SITE_OTHER): Payer: BLUE CROSS/BLUE SHIELD | Admitting: Family Medicine

## 2015-03-04 VITALS — BP 108/66 | HR 84 | Temp 98.4°F | Ht 70.5 in | Wt 247.0 lb

## 2015-03-04 DIAGNOSIS — S8002XS Contusion of left knee, sequela: Secondary | ICD-10-CM

## 2015-03-04 DIAGNOSIS — R609 Edema, unspecified: Secondary | ICD-10-CM

## 2015-03-04 DIAGNOSIS — E118 Type 2 diabetes mellitus with unspecified complications: Secondary | ICD-10-CM | POA: Diagnosis not present

## 2015-03-04 NOTE — Patient Instructions (Signed)
Take Lasix tonight and then take 1 on Monday Wednesday and Friday of next week and then only take it as needed  We'll call you with the results of the lab work once it becomes available  Be sure and follow-up with the endocrinologist next  Month as planned

## 2015-03-04 NOTE — Progress Notes (Signed)
Subjective:    Patient ID: Joseph Kirby, male    DOB: 1956/01/30, 59 y.o.   MRN: [id number]  HPI Patient here today for 10 day follow up on knee and edema.  The patient says his knee is hurting less and he's had less edema in both feet and is only taken about 4 or 5 days worth of Lasix one daily and he takes it at nighttime every night. He brings in outside blood work for review and all of his lab work was good except the A1c was elevated at 8.2%. He has an appointment with his endocrinologist around July 12 and he will take a copy of all of this blood work with him to see the endocrinologist at that time. His renal function his liver function kidney function Ann cholesterol numbers were all excellent.     Patient Active Problem List   Diagnosis Date Noted  . Diabetes, insulin-dependent 06/20/2012  . CAD (coronary artery disease)   . Hypertension   . Hypothyroidism   . Ejection fraction   . DKA (diabetic ketoacidoses), history of 03/17/2012  . BPH (benign prostatic hyperplasia)   . Colon polyp   . DYSLIPIDEMIA 03/27/2010   Outpatient Encounter Prescriptions as of 03/04/2015  Medication Sig  . aspirin 81 MG tablet Take 81 mg by mouth daily.    Marland Kitchen atorvastatin (LIPITOR) 80 MG tablet Take 1 tablet (80 mg total) by mouth daily.  . Cholecalciferol (VITAMIN D) 2000 UNITS CAPS Take 2 capsules by mouth daily.  . furosemide (LASIX) 20 MG tablet Take 1 tablet (20 mg total) by mouth daily.  Marland Kitchen glucose blood (ONE TOUCH ULTRA TEST) test strip FSBS bid  . insulin NPH Human (HUMULIN N) 100 UNIT/ML injection Inject 0.9 mLs (90 Units total) into the skin as directed.  . insulin regular (HUMULIN R) 100 units/mL injection Inject 0.1 mLs (10 Units total) into the skin 2 (two) times daily before a meal.  . INSULIN SYRINGE 1CC/29G 29G X 1/2" 1 ML MISC 1 each by Does not apply route 3 (three) times daily.  . Lancets (ONETOUCH ULTRASOFT) lancets Use as instructed  . levothyroxine (SYNTHROID, LEVOTHROID) 175  MCG tablet Take 1 tablet (175 mcg total) by mouth daily. None on Sundays  . niacin (NIASPAN) 500 MG CR tablet Take 3 tablets (1,500 mg total) by mouth at bedtime.  Marland Kitchen nystatin-triamcinolone (MYCOLOG II) cream Apply 1 application topically 2 (two) times daily.  Marland Kitchen omega-3 acid ethyl esters (LOVAZA) 1 G capsule Take 2 capsules (2 g total) by mouth 2 (two) times daily.  . ramipril (ALTACE) 5 MG capsule TAKE 1 CAPSULE DAILY  . ranitidine (ZANTAC) 150 MG tablet Take 150 mg by mouth 2 (two) times daily.   No facility-administered encounter medications on file as of 03/04/2015.      Review of Systems  Constitutional: Negative.   HENT: Negative.   Eyes: Negative.   Respiratory: Negative.   Cardiovascular: Positive for leg swelling.  Gastrointestinal: Negative.   Endocrine: Negative.   Genitourinary: Negative.   Musculoskeletal: Positive for arthralgias (knee pain).  Skin: Negative.   Allergic/Immunologic: Negative.   Neurological: Negative.   Hematological: Negative.   Psychiatric/Behavioral: Negative.        Objective:   Physical Exam  Constitutional: He is oriented to person, place, and time. He appears well-developed and well-nourished. No distress.  HENT:  Head: Normocephalic.  Eyes: Conjunctivae and EOM are normal. Pupils are equal, round, and reactive to light. Right eye exhibits no discharge. Left  eye exhibits no discharge. No scleral icterus.  Neck: Normal range of motion.  Musculoskeletal: Normal range of motion. He exhibits tenderness. He exhibits no edema.  Left patella and left anterior leg had minimal tenderness to palpation. There was minimal if any edema on either foot today. The previous x-ray was read as a bipolar take patella and not a fracture.  Neurological: He is alert and oriented to person, place, and time.  Skin: Skin is warm and dry. No rash noted.  Psychiatric: He has a normal mood and affect. His behavior is normal. Thought content normal.  Nursing note and  vitals reviewed.         Assessment & Plan:  1. Edema -The edema is much improved with taking Lasix daily for 45 days. He will take only 3 next week and will only take it after that as needed. We will get a BMP today to make sure his electrolytes are stable.  2. Knee contusion, left, sequela -The left knee and patellar are no longer tender and this is also feeling better.  3. Type 2 diabetes mellitus with complication -After reviewing the patient's blood work today and his elevated hemoglobin A1c at 8.2%, it was emphasized to him that he must follow-up with his endocrinologist and he does have an appointment around July 12. He will take a copy of all the blood work with him to see the endocrinologist at that time.  Patient Instructions   Take Lasix tonight and then take 1 on Monday Wednesday and Friday of next week and then only take it as needed  We'll call you with the results of the lab work once it becomes available  Be sure and follow-up with the endocrinologist next  Month as planned   Nyra Capes MD

## 2015-03-05 LAB — BMP8+EGFR
BUN/Creatinine Ratio: 16 (ref 9–20)
BUN: 16 mg/dL (ref 6–24)
CALCIUM: 9.5 mg/dL (ref 8.7–10.2)
CO2: 26 mmol/L (ref 18–29)
Chloride: 102 mmol/L (ref 97–108)
Creatinine, Ser: 0.98 mg/dL (ref 0.76–1.27)
GFR calc Af Amer: 98 mL/min/{1.73_m2} (ref >59)
GFR, EST NON AFRICAN AMERICAN: 85 mL/min/{1.73_m2} (ref >59)
GLUCOSE: 127 mg/dL — AB (ref 65–99)
Potassium: 3.9 mmol/L (ref 3.5–5.2)
Sodium: 141 mmol/L (ref 134–144)

## 2015-03-23 ENCOUNTER — Other Ambulatory Visit: Payer: Self-pay | Admitting: Family Medicine

## 2015-03-23 ENCOUNTER — Other Ambulatory Visit: Payer: Self-pay | Admitting: Nurse Practitioner

## 2015-06-16 ENCOUNTER — Other Ambulatory Visit: Payer: Self-pay | Admitting: Family Medicine

## 2015-06-24 ENCOUNTER — Encounter: Payer: Self-pay | Admitting: Family Medicine

## 2015-06-24 ENCOUNTER — Ambulatory Visit (INDEPENDENT_AMBULATORY_CARE_PROVIDER_SITE_OTHER): Payer: BLUE CROSS/BLUE SHIELD | Admitting: Family Medicine

## 2015-06-24 VITALS — BP 102/68 | HR 67 | Temp 97.7°F | Ht 70.5 in | Wt 245.0 lb

## 2015-06-24 DIAGNOSIS — E785 Hyperlipidemia, unspecified: Secondary | ICD-10-CM

## 2015-06-24 DIAGNOSIS — N400 Enlarged prostate without lower urinary tract symptoms: Secondary | ICD-10-CM

## 2015-06-24 DIAGNOSIS — I10 Essential (primary) hypertension: Secondary | ICD-10-CM | POA: Diagnosis not present

## 2015-06-24 DIAGNOSIS — G4700 Insomnia, unspecified: Secondary | ICD-10-CM | POA: Diagnosis not present

## 2015-06-24 DIAGNOSIS — E118 Type 2 diabetes mellitus with unspecified complications: Secondary | ICD-10-CM

## 2015-06-24 DIAGNOSIS — G479 Sleep disorder, unspecified: Secondary | ICD-10-CM | POA: Diagnosis not present

## 2015-06-24 DIAGNOSIS — R0683 Snoring: Secondary | ICD-10-CM | POA: Diagnosis not present

## 2015-06-24 DIAGNOSIS — R5383 Other fatigue: Secondary | ICD-10-CM | POA: Diagnosis not present

## 2015-06-24 DIAGNOSIS — E039 Hypothyroidism, unspecified: Secondary | ICD-10-CM

## 2015-06-24 DIAGNOSIS — E559 Vitamin D deficiency, unspecified: Secondary | ICD-10-CM

## 2015-06-24 DIAGNOSIS — Z23 Encounter for immunization: Secondary | ICD-10-CM | POA: Diagnosis not present

## 2015-06-24 NOTE — Patient Instructions (Addendum)
Continue current medications. Continue good therapeutic lifestyle changes which include good diet and exercise. Fall precautions discussed with patient. If an FOBT was given today- please return it to our front desk. If you are over 58 years old - you may need Prevnar 13 or the adult Pneumonia vaccine.  **Flu shots are available--- please call and schedule a FLU-CLINIC appointment**  After your visit with us today you will receive a survey in the mail or online from [employer] regarding your care with us. Please take a moment to fill this out. Your feedback is very important to us as you can help us better understand your patient needs as well as improve your experience and satisfaction. WE CARE ABOUT YOU!!!   The patient should continue to follow-up with lab work from work. He should bring this to each office visit for us to review and also take a copy to his endocrinologist He should continue to monitor his blood sugars regularly his blood pressures regularly and check his feet regularly According to the most recent lab work he needs to try to do better with his blood sugars as the A1c was somewhat elevated at 7.9.----More exercise and better diet management He will receive the flu shot today. We will arrange for a visit with the cardiologist for routine purposes and because of the history of diabetes. We will also arrange a visit for a sleep apnea evaluation because of daytime fatigue and nighttime snoring and trouble staying asleep at nighttime

## 2015-06-24 NOTE — Progress Notes (Signed)
Subjective:    Patient ID: Joseph Kirby, male    DOB: April 19, 1956, 59 y.o.   MRN: [id number]  HPI Pt here for follow up and management of chronic medical problems which includes hyperlipidemia, hypertension, and diabetes. He is taking medications regularly. The patient's biggest problem and complaint today is his insomnia. He brings a copy to the office of his lab work and will get his flu shot today in this office. The patient sees a Publishing rights manager at work and gets his routine lab work there. He also sees an endocrinologist at the [facility] and [place] to follow his diabetes. He used to see the cardiologist but has not seen him in a good while and that particular cardiologist has now retired. The patient denies chest pain shortness of breath trouble swallowing heartburn indigestion and nausea vomiting diarrhea or blood in the stool. His last colonoscopy was in 2009. He also passes his water without problems. He has seen the eye doctor this year and will try to get [place] a copy of that exam report. A copy of the recent blood work from work will be scanned into the record.      Patient Active Problem List   Diagnosis Date Noted  . Diabetes, insulin-dependent 06/20/2012  . CAD (coronary artery disease)   . Hypertension   . Hypothyroidism   . Ejection fraction   . DKA (diabetic ketoacidoses), history of 03/17/2012  . BPH (benign prostatic hyperplasia)   . Colon polyp   . DYSLIPIDEMIA 03/27/2010   Outpatient Encounter Prescriptions as of 06/24/2015  Medication Sig  . aspirin 81 MG tablet Take 81 mg by mouth daily.    Marland Kitchen atorvastatin (LIPITOR) 80 MG tablet Take 1 tablet (80 mg total) by mouth daily.  . B-D INS SYR ULTRAFINE 1CC/30G 30G X 1/2" 1 ML MISC USE AS DIRECTED THREE TIMES A DAY  . Cholecalciferol (VITAMIN D) 2000 UNITS CAPS Take 2 capsules by mouth daily.  Marland Kitchen glucose blood (ONE TOUCH ULTRA TEST) test strip FSBS bid  . insulin NPH Human (HUMULIN N) 100 UNIT/ML injection Inject  0.9 mLs (90 Units total) into the skin as directed.  . insulin regular (HUMULIN R) 100 units/mL injection Inject 0.1 mLs (10 Units total) into the skin 2 (two) times daily before a meal.  . INSULIN SYRINGE 1CC/29G 29G X 1/2" 1 ML MISC 1 each by Does not apply route 3 (three) times daily.  . Lancets (ONETOUCH ULTRASOFT) lancets Use as instructed  . levothyroxine (SYNTHROID, LEVOTHROID) 175 MCG tablet TAKE 1 TABLET DAILY. NONE ON SUNDAYS  . niacin (NIASPAN) 500 MG CR tablet Take 3 tablets (1,500 mg total) by mouth at bedtime.  Marland Kitchen nystatin-triamcinolone (MYCOLOG II) cream Apply 1 application topically 2 (two) times daily.  Marland Kitchen omega-3 acid ethyl esters (LOVAZA) 1 G capsule Take 2 capsules (2 g total) by mouth 2 (two) times daily.  . ramipril (ALTACE) 5 MG capsule TAKE 1 CAPSULE DAILY  . ranitidine (ZANTAC) 150 MG tablet Take 150 mg by mouth 2 (two) times daily.  . [DISCONTINUED] furosemide (LASIX) 20 MG tablet Take 1 tablet (20 mg total) by mouth daily.   No facility-administered encounter medications on file as of 06/24/2015.      Review of Systems  Constitutional: Negative.        Trouble sleeping  HENT: Negative.   Eyes: Negative.   Respiratory: Negative.   Cardiovascular: Negative.   Gastrointestinal: Negative.   Endocrine: Negative.   Genitourinary: Negative.   Musculoskeletal: Negative.  Skin: Negative.   Allergic/Immunologic: Negative.   Neurological: Negative.   Hematological: Negative.   Psychiatric/Behavioral: Negative.        Objective:   Physical Exam  Constitutional: He is oriented to person, place, and time. He appears well-developed and well-nourished. No distress.  Pleasant and laid back demeanor  HENT:  Head: Normocephalic and atraumatic.  Right Ear: External ear normal.  Left Ear: External ear normal.  Nose: Nose normal.  Mouth/Throat: Oropharynx is clear and moist. No oropharyngeal exudate.  Eyes: Conjunctivae and EOM are normal. Pupils are equal, round, and  reactive to light. Right eye exhibits no discharge. Left eye exhibits no discharge. No scleral icterus.  Neck: Normal range of motion. Neck supple. No thyromegaly present.  No carotid bruits thyromegaly or anterior cervical adenopathy  Cardiovascular: Normal rate, regular rhythm, normal heart sounds and intact distal pulses.   No murmur heard. The heart has a regular rate and rhythm at 72/m  Pulmonary/Chest: Effort normal and breath sounds normal. No respiratory distress. He has no wheezes. He has no rales. He exhibits no tenderness.  Clear anteriorly and posteriorly without axillary adenopathy  Abdominal: Soft. Bowel sounds are normal. He exhibits no mass. There is no tenderness. There is no rebound and no guarding.  No masses tenderness or organ enlargement and no inguinal adenopathy  Musculoskeletal: Normal range of motion. He exhibits no edema.  Lymphadenopathy:    He has no cervical adenopathy.  Neurological: He is alert and oriented to person, place, and time. He has normal reflexes. No cranial nerve deficit.  Skin: Skin is warm and dry. No rash noted.  Psychiatric: He has a normal mood and affect. His behavior is normal. Judgment and thought content normal.  Nursing note and vitals reviewed.   BP 89/52 mmHg  Pulse 67  Temp(Src) 97.7 F (36.5 C) (Oral)  Ht 5' 10.5" (1.791 m)  Wt 245 lb (111.131 kg)  BMI 34.65 kg/m2       Assessment & Plan:  1. Type 2 diabetes mellitus with complication, without long-term current use of insulin (HCC) -The patient will continue to follow up with his endocrinologist -He'll try to do better with his blood sugar control with diet and exercise as his A1c most recently was 7.9%. The goal for this number will be to get it down closer to 7%. - Ambulatory referral to Cardiology  2. Hypothyroidism, unspecified hypothyroidism type -The most recent TSH was good and within normal limits and the patient will continue with current treatment  3. Essential  hypertension -This patient's blood pressure runs on the low end of the range and he continues to take Altase 5 mg daily mostly because he is diabetic. He will continue with the current dose of medicine and the repeat blood pressure in the office today was 102/68. - Ambulatory referral to Cardiology  4. BPH (benign prostatic hyperplasia) -He has had his prostate exam within the year and he is having no symptoms with passing his water.  5. Hyperlipidemia -The most recent cholesterol numbers were good and he will continue with his current treatment - Ambulatory referral to Cardiology  6. Vitamin D deficiency -His vitamin D level was good he will continue with the current treatment  7. Encounter for immunization -He'll get his flu shot today  8. Trouble in sleeping -Because of the daytime fatigue and history of snoring and insomnia we will arrange to get a sleep apnea evaluation. - Ambulatory referral to Pulmonology  9. Snoring -Sleep apnea evaluation -No  treatment for insomnia until this evaluation is completed - Ambulatory referral to Pulmonology  10. Other fatigue -Sleep apnea evaluation  11. Insomnia -Sleep apnea evaluation -Try to decrease late afternoon caffeine intake  Patient Instructions    Continue current medications. Continue good therapeutic lifestyle changes which include good diet and exercise. Fall precautions discussed with patient. If an FOBT was given today- please return it to our front desk. If you are over 16 years old - you may need Prevnar 13 or the adult Pneumonia vaccine.  **Flu shots are available--- please call and schedule a FLU-CLINIC appointment**  After your visit with [place] today you will receive a survey in the mail or online from [employer] regarding your care with [place]. Please take a moment to fill this out. Your feedback is very important to [place] as you can help [place] better understand your patient needs as well as improve your experience and  satisfaction. WE CARE ABOUT YOU!!!   The patient should continue to follow-up with lab work from work. He should bring this to each office visit for [place] to review and also take a copy to his endocrinologist He should continue to monitor his blood sugars regularly his blood pressures regularly and check his feet regularly According to the most recent lab work he needs to try to do better with his blood sugars as the A1c was somewhat elevated at 7.9.----More exercise and better diet management He will receive the flu shot today. We will arrange for a visit with the cardiologist for routine purposes and because of the history of diabetes. We will also arrange a visit for a sleep apnea evaluation because of daytime fatigue and nighttime snoring and trouble staying asleep at nighttime   Nyra Capes MD

## 2015-07-01 ENCOUNTER — Telehealth: Payer: Self-pay | Admitting: Family Medicine

## 2015-07-01 NOTE — Telephone Encounter (Signed)
LM - what is the reasoning - can not order with reason - jhb

## 2015-07-01 NOTE — Telephone Encounter (Signed)
Please arrange to get this as soon as possible at patient's requested location

## 2015-07-01 NOTE — Telephone Encounter (Signed)
[facility] the facility the pt wants to have the sleep study done at

## 2015-07-04 NOTE — Telephone Encounter (Signed)
Pt aware of appointment date/time of 07/26/2015 at 9pm at [facility] in [place]

## 2015-07-14 ENCOUNTER — Telehealth: Payer: Self-pay | Admitting: Family Medicine

## 2015-07-14 MED ORDER — AZITHROMYCIN 250 MG PO TABS: ORAL_TABLET | ORAL | Status: DC

## 2015-07-14 NOTE — Telephone Encounter (Signed)
Please call a Z-Pak in for this patient and have him also pick up some blue and white Mucinex plain maximum strength and take one twice daily with a large glass of water for cough and congestion

## 2015-07-14 NOTE — Telephone Encounter (Signed)
Medication ordered and patient aware

## 2015-07-14 NOTE — Telephone Encounter (Signed)
Please call in for this patient a prescription for a Z-Pak and have him pick up some Mucinex, maximum strength, blue and white in color, and take one twice daily for cough and congestion

## 2015-08-10 ENCOUNTER — Encounter: Payer: Self-pay | Admitting: Cardiology

## 2015-08-10 ENCOUNTER — Ambulatory Visit (INDEPENDENT_AMBULATORY_CARE_PROVIDER_SITE_OTHER): Payer: BLUE CROSS/BLUE SHIELD | Admitting: Cardiology

## 2015-08-10 VITALS — BP 110/68 | HR 84 | Ht 71.0 in | Wt 250.0 lb

## 2015-08-10 DIAGNOSIS — I10 Essential (primary) hypertension: Secondary | ICD-10-CM

## 2015-08-10 DIAGNOSIS — I2510 Atherosclerotic heart disease of native coronary artery without angina pectoris: Secondary | ICD-10-CM

## 2015-08-10 NOTE — Patient Instructions (Signed)
Medication Instructions:  The current medical regimen is effective;  continue present plan and medications.  Testing/Procedures: Your physician has requested that you have an exercise tolerance test. For further information please visit [email or web address]. Please also follow instruction sheet, as given.  Follow-Up: Follow up will be based on these results.   Thank you for choosing [facility]!!

## 2015-08-10 NOTE — Progress Notes (Signed)
Cardiology Office Note   Date:  08/10/2015   ID:  Joseph Kirby, DOB 09/23/1955, MRN [id number]  PCP:  Jannifer Rodney, FNP  Cardiologist:   Rollene Rotunda, MD   Chief Complaint  Patient presents with  . Coronary Artery Disease      History of Present Illness: Joseph Kirby is a 59 y.o. male who presents for evaluation of coronary disease and multiple cardiovascular risk factors. He had a history in 2012 elevated cardiac catheterization and was found to have 30% disease and all of his major coronary distributions. He had a stress test in 2013 was negative for any evidence of ischemia on imaging. He doesn't have ongoing symptoms but he has poorly controlled diabetes. He has ongoing risk factors. 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 The patient denies any new symptoms such as chest discomfort, neck or arm discomfort. There has been no new shortness of breath, PND or orthopnea. There have been no reported palpitations, presyncope or syncope.  Past Medical History  Diagnosis Date  . BPH (benign prostatic hyperplasia)   . Colon polyp   . CAD (coronary artery disease)     Nonobstructive disease by catheterization, 2003  . Hyperlipidemia   . Diabetes mellitus   . Hypothyroidism   . Ejection fraction     EF 55%, catheterization, 2003  . Sleep apnea     Past Surgical History  Procedure Laterality Date  . Cardiac catheterization  2003    NONOBSTRUCTIVE, EF 55%  . Nasal septum surgery       Current Outpatient Prescriptions  Medication Sig Dispense Refill  . aspirin 81 MG tablet Take 81 mg by mouth daily.      Marland Kitchen atorvastatin (LIPITOR) 80 MG tablet Take 1 tablet (80 mg total) by mouth daily. 90 tablet 3  . Cholecalciferol (VITAMIN D) 2000 UNITS CAPS Take 2 capsules by mouth daily.    . insulin NPH Human (HUMULIN N) 100 UNIT/ML injection Inject 0.9 mLs (90 Units total) into the skin as directed. 90 mL 3  . insulin regular (HUMULIN R) 100  units/mL injection Inject 0.1 mLs (10 Units total) into the skin 2 (two) times daily before a meal. 30 mL 3  . levothyroxine (SYNTHROID, LEVOTHROID) 175 MCG tablet Take 175 mcg by mouth. Take one tablet by mouth daily except on Sat. & Sun take 1/2 tablet by mouth    . niacin (NIASPAN) 500 MG CR tablet Take 3 tablets (1,500 mg total) by mouth at bedtime. 270 tablet 3  . omega-3 acid ethyl esters (LOVAZA) 1 G capsule Take 2 capsules (2 g total) by mouth 2 (two) times daily. 360 capsule 3  . ramipril (ALTACE) 5 MG capsule Take 5 mg by mouth daily.    . ranitidine (ZANTAC) 150 MG tablet Take 150 mg by mouth 2 (two) times daily.    . B-D INS SYR ULTRAFINE 1CC/30G 30G X 1/2" 1 ML MISC USE AS DIRECTED THREE TIMES A DAY 300 each 2  . glucose blood (ONE TOUCH ULTRA TEST) test strip FSBS bid 300 each 1  . INSULIN SYRINGE 1CC/29G 29G X 1/2" 1 ML MISC 1 each by Does not apply route 3 (three) times daily. 300 each 3  . Lancets (ONETOUCH ULTRASOFT) lancets Use as instructed 300 each 1  . nystatin-triamcinolone (MYCOLOG II) cream Apply 1 application topically 2 (two) times daily. (Patient not taking: Reported on 08/10/2015) 30 g 1   No current  facility-administered medications for this visit.    Allergies:   Ezetimibe-simvastatin; Iodine; and Shrimp    Social History:  The patient  reports that he quit smoking about 22 years ago. His smoking use included Cigarettes. He has never used smokeless tobacco. He reports that he does not drink alcohol or use illicit drugs.   Family History:  The patient's family history includes Diabetes in his sister; Heart disease in his father.    ROS:  Please see the history of present illness.   Otherwise, review of systems are positive for none.   All other systems are reviewed and negative.    PHYSICAL EXAM: VS:  BP 110/68 mmHg  Pulse 84  Ht 5\' 11"  (1.803 m)  Wt 250 lb (113.399 kg)  BMI 34.88 kg/m2 , BMI Body mass index is 34.88 kg/(m^2). GENERAL:  Well  appearing HEENT:  Pupils equal round and reactive, fundi not visualized, oral mucosa unremarkable NECK:  No jugular venous distention, waveform within normal limits, carotid upstroke brisk and symmetric, no bruits, no thyromegaly LYMPHATICS:  No cervical, inguinal adenopathy LUNGS:  Clear to auscultation bilaterally BACK:  No CVA tenderness CHEST:  Unremarkable HEART:  PMI not displaced or sustained,S1 and S2 within normal limits, no S3, no S4, no clicks, no rubs, no murmurs ABD:  Flat, positive bowel sounds normal in frequency in pitch, no bruits, no rebound, no guarding, no midline pulsatile mass, no hepatomegaly, no splenomegaly EXT:  2 plus pulses throughout, no edema, no cyanosis no clubbing SKIN:  No rashes no nodules NEURO:  Cranial nerves II through XII grossly intact, motor grossly intact throughout PSYCH:  Cognitively intact, oriented to person place and time    EKG:  EKG is ordered today. The ekg ordered today demonstrates sinus rhythm, rate 84, axis within normal limits, intervals within normal limits, no acute ST-T wave changes.   Recent Labs: 09/09/2014: Hemoglobin 15.2; TSH 2.560 11/09/2014: ALT 23; BNP 38.6 03/04/2015: BUN 16; Creatinine, Ser 0.98; Potassium 3.9; Sodium 141    Lipid Panel    Component Value Date/Time   CHOL 158 09/09/2014 1022   CHOL 119 12/25/2012 0952   TRIG 77 09/09/2014 1022   TRIG 39 12/25/2012 0952   HDL 45 09/09/2014 1022   HDL 42 12/25/2012 0952   LDLCALC 81 05/18/2014 1025   LDLCALC 69 12/25/2012 0952     Lab Results  Component Value Date   HGBA1C 7.9 09/09/2014     Wt Readings from Last 3 Encounters:  08/10/15 250 lb (113.399 kg)  06/24/15 245 lb (111.131 kg)  03/04/15 247 lb (112.038 kg)      Other studies Reviewed: Additional studies/ records that were reviewed today include: Cath and Lexiscan Myoview.. Review of the above records demonstrates:  Please see elsewhere in the note.     ASSESSMENT AND PLAN:  CAD:  The  patient has nonobstructive disease. However, he has significant cardiovascular risk factors.  I will bring the patient back for a POET (Plain Old Exercise Test). This will allow me to screen for obstructive coronary disease, risk stratify and very importantly provide a prescription for exercise.  OBESITY:  The patient understands the need to lose weight with diet and exercise. We have discussed specific strategies for this.  DYSLIPIDEMIA:  Per Jannifer Rodneyhristy Hawks, FNP     Current medicines are reviewed at length with the patient today.  The patient does not have concerns regarding medicines.  The following changes have been made:  no change  Labs/ tests  ordered today include:   No orders of the defined types were placed in this encounter.     Disposition:   FU with me as needed.      Signed, Rollene Rotunda, MD  08/10/2015 4:12 PM    [facility]

## 2015-08-23 ENCOUNTER — Telehealth: Payer: Self-pay | Admitting: Family Medicine

## 2015-08-26 NOTE — Telephone Encounter (Signed)
Pt was referred to pulmonary for this and was to be evaluated. Pt aware that if he needs this - they will order - he will call them Monday.

## 2015-08-26 NOTE — Telephone Encounter (Signed)
I don't know about this? Was I supposed to order? I have not seen anything

## 2015-08-30 ENCOUNTER — Encounter: Payer: BLUE CROSS/BLUE SHIELD | Admitting: Nurse Practitioner

## 2015-08-30 ENCOUNTER — Ambulatory Visit (INDEPENDENT_AMBULATORY_CARE_PROVIDER_SITE_OTHER): Payer: BLUE CROSS/BLUE SHIELD

## 2015-08-30 DIAGNOSIS — I2510 Atherosclerotic heart disease of native coronary artery without angina pectoris: Secondary | ICD-10-CM

## 2015-08-30 DIAGNOSIS — I10 Essential (primary) hypertension: Secondary | ICD-10-CM

## 2015-08-30 LAB — EXERCISE TOLERANCE TEST
CHL RATE OF PERCEIVED EXERTION: 17
CSEPED: 7 min
CSEPEDS: 0 s
CSEPEW: 8.5 METS
CSEPHR: 94 %
MPHR: 162 {beats}/min
Peak HR: 153 {beats}/min
Rest HR: 74 {beats}/min

## 2015-08-31 ENCOUNTER — Encounter: Payer: Self-pay | Admitting: Family Medicine

## 2015-08-31 ENCOUNTER — Encounter: Payer: BLUE CROSS/BLUE SHIELD | Admitting: Family Medicine

## 2015-08-31 MED ORDER — NYSTATIN-TRIAMCINOLONE 100000-0.1 UNIT/GM-% EX CREA: 1.0000 "application " | TOPICAL_CREAM | Freq: Two times a day (BID) | CUTANEOUS | Status: DC

## 2015-08-31 NOTE — Progress Notes (Deleted)
   Subjective:    Patient ID: Joseph Kirby, male    DOB: 11-15-1955, 59 y.o.   MRN: [id number]  HPI Patient here today for DMV forms to be filled out. He has to do this every 2 years for diabetic reasons.      Patient Active Problem List   Diagnosis Date Noted  . Diabetes, insulin-dependent 06/20/2012  . CAD (coronary artery disease)   . Hypertension   . Hypothyroidism   . Ejection fraction   . DKA (diabetic ketoacidoses), history of 03/17/2012  . BPH (benign prostatic hyperplasia)   . Colon polyp   . DYSLIPIDEMIA 03/27/2010   Outpatient Encounter Prescriptions as of 08/31/2015  Medication Sig  . aspirin 81 MG tablet Take 81 mg by mouth daily.    Marland Kitchen. atorvastatin (LIPITOR) 80 MG tablet Take 1 tablet (80 mg total) by mouth daily.  . B-D INS SYR ULTRAFINE 1CC/30G 30G X 1/2" 1 ML MISC USE AS DIRECTED THREE TIMES A DAY  . Cholecalciferol (VITAMIN D) 2000 UNITS CAPS Take 2 capsules by mouth daily.  Marland Kitchen. glucose blood (ONE TOUCH ULTRA TEST) test strip FSBS bid  . insulin NPH Human (HUMULIN N) 100 UNIT/ML injection Inject 0.9 mLs (90 Units total) into the skin as directed.  . insulin regular (HUMULIN R) 100 units/mL injection Inject 0.1 mLs (10 Units total) into the skin 2 (two) times daily before a meal.  . INSULIN SYRINGE 1CC/29G 29G X 1/2" 1 ML MISC 1 each by Does not apply route 3 (three) times daily.  . Lancets (ONETOUCH ULTRASOFT) lancets Use as instructed  . levothyroxine (SYNTHROID, LEVOTHROID) 175 MCG tablet Take 175 mcg by mouth. Take one tablet by mouth daily except on Sat. & Sun take 1/2 tablet by mouth  . niacin (NIASPAN) 500 MG CR tablet Take 3 tablets (1,500 mg total) by mouth at bedtime.  Marland Kitchen. nystatin-triamcinolone (MYCOLOG II) cream Apply 1 application topically 2 (two) times daily.  Marland Kitchen. omega-3 acid ethyl esters (LOVAZA) 1 G capsule Take 2 capsules (2 g total) by mouth 2 (two) times daily.  . ramipril (ALTACE) 5 MG capsule Take 5 mg by mouth daily.  . ranitidine (ZANTAC) 150 MG  tablet Take 150 mg by mouth 2 (two) times daily.  . [DISCONTINUED] nystatin-triamcinolone (MYCOLOG II) cream Apply 1 application topically 2 (two) times daily.   No facility-administered encounter medications on file as of 08/31/2015.      Review of Systems  Constitutional: Negative.   HENT: Negative.   Eyes: Negative.   Respiratory: Negative.   Cardiovascular: Negative.   Gastrointestinal: Negative.   Endocrine: Negative.   Genitourinary: Negative.   Musculoskeletal: Negative.   Skin: Negative.   Allergic/Immunologic: Negative.   Neurological: Negative.   Hematological: Negative.   Psychiatric/Behavioral: Negative.        Objective:   Physical Exam   BP 111/62 mmHg  Pulse 69  Temp(Src) 98.6 F (37 C) (Oral)  Ht 5\' 11"  (1.803 m)  Wt 251 lb (113.853 kg)  BMI 35.02 kg/m2      Assessment & Plan:

## 2015-08-31 NOTE — Progress Notes (Signed)
This encounter was created in error - please disregard.

## 2015-09-01 ENCOUNTER — Encounter: Payer: Self-pay | Admitting: Family Medicine

## 2015-09-15 ENCOUNTER — Other Ambulatory Visit: Payer: Self-pay | Admitting: Family Medicine

## 2015-09-16 ENCOUNTER — Encounter: Payer: Self-pay | Admitting: Family Medicine

## 2015-09-16 MED ORDER — INSULIN NPH (HUMAN) (ISOPHANE) 100 UNIT/ML ~~LOC~~ SUSP: SUBCUTANEOUS | Status: DC

## 2015-09-16 NOTE — Telephone Encounter (Signed)
Done, ordered this am, left message

## 2015-09-20 ENCOUNTER — Other Ambulatory Visit: Payer: Self-pay

## 2015-09-20 MED ORDER — INSULIN NPH (HUMAN) (ISOPHANE) 100 UNIT/ML ~~LOC~~ SUSP: SUBCUTANEOUS | Status: DC

## 2015-09-22 LAB — HEMOGLOBIN A1C: Hemoglobin A1C: 8.2

## 2015-09-27 ENCOUNTER — Institutional Professional Consult (permissible substitution): Payer: PRIVATE HEALTH INSURANCE | Admitting: Pulmonary Disease

## 2015-10-04 ENCOUNTER — Other Ambulatory Visit: Payer: Self-pay | Admitting: *Deleted

## 2015-10-04 MED ORDER — ATORVASTATIN CALCIUM 80 MG PO TABS: 80.0000 mg | ORAL_TABLET | Freq: Every day | ORAL | Status: DC

## 2015-10-04 MED ORDER — NIACIN ER (ANTIHYPERLIPIDEMIC) 500 MG PO TBCR: 1500.0000 mg | EXTENDED_RELEASE_TABLET | Freq: Every day | ORAL | Status: DC

## 2015-10-04 MED ORDER — RAMIPRIL 5 MG PO CAPS: 5.0000 mg | ORAL_CAPSULE | Freq: Every day | ORAL | Status: DC

## 2015-10-06 ENCOUNTER — Telehealth: Payer: Self-pay | Admitting: Family Medicine

## 2015-10-06 ENCOUNTER — Other Ambulatory Visit: Payer: Self-pay

## 2015-10-06 MED ORDER — INSULIN NPH (HUMAN) (ISOPHANE) 100 UNIT/ML ~~LOC~~ SUSP: SUBCUTANEOUS | Status: DC

## 2015-10-06 MED ORDER — INSULIN REGULAR HUMAN 100 UNIT/ML IJ SOLN: 10.0000 [IU] | Freq: Two times a day (BID) | INTRAMUSCULAR | Status: DC

## 2015-10-07 MED ORDER — LEVOTHYROXINE SODIUM 175 MCG PO TABS: 175.0000 ug | ORAL_TABLET | Freq: Every day | ORAL | Status: DC

## 2015-10-07 NOTE — Telephone Encounter (Signed)
All meds had already been ordered except the levothyroxine, pt. aware

## 2015-10-14 ENCOUNTER — Other Ambulatory Visit: Payer: Self-pay | Admitting: *Deleted

## 2015-10-14 MED ORDER — NIACIN ER (ANTIHYPERLIPIDEMIC) 500 MG PO TBCR: 1500.0000 mg | EXTENDED_RELEASE_TABLET | Freq: Every day | ORAL | Status: DC

## 2015-10-26 ENCOUNTER — Encounter: Payer: Self-pay | Admitting: Family Medicine

## 2015-10-26 ENCOUNTER — Ambulatory Visit (INDEPENDENT_AMBULATORY_CARE_PROVIDER_SITE_OTHER): Payer: BLUE CROSS/BLUE SHIELD | Admitting: Family Medicine

## 2015-10-26 VITALS — BP 98/53 | HR 81 | Temp 97.2°F | Ht 71.0 in | Wt 251.0 lb

## 2015-10-26 DIAGNOSIS — E559 Vitamin D deficiency, unspecified: Secondary | ICD-10-CM

## 2015-10-26 DIAGNOSIS — I10 Essential (primary) hypertension: Secondary | ICD-10-CM

## 2015-10-26 DIAGNOSIS — E785 Hyperlipidemia, unspecified: Secondary | ICD-10-CM

## 2015-10-26 DIAGNOSIS — N5201 Erectile dysfunction due to arterial insufficiency: Secondary | ICD-10-CM

## 2015-10-26 DIAGNOSIS — E118 Type 2 diabetes mellitus with unspecified complications: Secondary | ICD-10-CM

## 2015-10-26 DIAGNOSIS — E039 Hypothyroidism, unspecified: Secondary | ICD-10-CM

## 2015-10-26 DIAGNOSIS — N400 Enlarged prostate without lower urinary tract symptoms: Secondary | ICD-10-CM | POA: Diagnosis not present

## 2015-10-26 DIAGNOSIS — IMO0001 Reserved for inherently not codable concepts without codable children: Secondary | ICD-10-CM

## 2015-10-26 MED ORDER — SILDENAFIL CITRATE 20 MG PO TABS: ORAL_TABLET | ORAL | Status: DC

## 2015-10-26 NOTE — Progress Notes (Signed)
Subjective:    Patient ID: Joseph Kirby, male    DOB: Mar 01, 1956, 60 y.o.   MRN: [id number]  HPI Pt here for follow up and management of chronic medical problems which includes hypertension, hyperlipidemia, hypothyroid, and diabetes. He is taking medications regularly. The patient comes to the visit today and his usual has no complaints despite being an insulin-dependent diabetic. His BMI is 35 and this is way too high and we will discuss efforts today of getting this down lower. The patient sees his endocrinologist at the [facility] in [place] every 3-4 months. He says his last hemoglobin A1c was in the 8.0 range. He says his blood sugars have been running higher. He is working at [employer] regularly. He denies any chest pain shortness of breath trouble swallowing heartburn indigestion nausea vomiting diarrhea or blood in the stool. He does have erectile dysfunction. He has had a stress test and he says this was normal by the cardiologist. This was done in December by Dr. Percival Spanish.      Patient Active Problem List   Diagnosis Date Noted  . Diabetes, insulin-dependent 06/20/2012  . CAD (coronary artery disease)   . Hypertension   . Hypothyroidism   . Ejection fraction   . DKA (diabetic ketoacidoses), history of 03/17/2012  . BPH (benign prostatic hyperplasia)   . Colon polyp   . DYSLIPIDEMIA 03/27/2010   Outpatient Encounter Prescriptions as of 10/26/2015  Medication Sig  . aspirin 81 MG tablet Take 81 mg by mouth daily.    Marland Kitchen atorvastatin (LIPITOR) 80 MG tablet Take 1 tablet (80 mg total) by mouth daily.  . B-D INS SYR ULTRAFINE 1CC/30G 30G X 1/2" 1 ML MISC USE AS DIRECTED THREE TIMES A DAY  . Cholecalciferol (VITAMIN D) 2000 UNITS CAPS Take 2 capsules by mouth daily.  Marland Kitchen glucose blood (ONE TOUCH ULTRA TEST) test strip FSBS bid  . insulin NPH Human (HUMULIN N) 100 UNIT/ML injection Inject 50u in am, 40u in pm as directed  . insulin regular (HUMULIN R) 100 units/mL injection Inject  0.1 mLs (10 Units total) into the skin 2 (two) times daily before a meal.  . Lancets (ONETOUCH ULTRASOFT) lancets Use as instructed  . levothyroxine (SYNTHROID, LEVOTHROID) 175 MCG tablet Take 1 tablet (175 mcg total) by mouth daily before breakfast.  . niacin (NIASPAN) 500 MG CR tablet Take 3 tablets (1,500 mg total) by mouth at bedtime.  Marland Kitchen nystatin-triamcinolone (MYCOLOG II) cream Apply 1 application topically 2 (two) times daily.  Marland Kitchen omega-3 acid ethyl esters (LOVAZA) 1 G capsule Take 2 capsules (2 g total) by mouth 2 (two) times daily.  . ramipril (ALTACE) 5 MG capsule Take 1 capsule (5 mg total) by mouth daily.  . ranitidine (ZANTAC) 150 MG tablet Take 150 mg by mouth 2 (two) times daily.  . [DISCONTINUED] INSULIN SYRINGE 1CC/29G 29G X 1/2" 1 ML MISC 1 each by Does not apply route 3 (three) times daily.   No facility-administered encounter medications on file as of 10/26/2015.      Review of Systems  Constitutional: Negative.   HENT: Negative.   Eyes: Negative.   Respiratory: Negative.   Cardiovascular: Negative.   Gastrointestinal: Negative.   Endocrine: Negative.   Genitourinary: Negative.   Musculoskeletal: Negative.   Skin: Negative.   Allergic/Immunologic: Negative.   Neurological: Negative.   Hematological: Negative.   Psychiatric/Behavioral: Negative.        Objective:   Physical Exam  Constitutional: He is oriented to person, place, and  time. He appears well-developed and well-nourished. No distress.  HENT:  Head: Normocephalic and atraumatic.  Right Ear: External ear normal.  Left Ear: External ear normal.  Mouth/Throat: Oropharynx is clear and moist. No oropharyngeal exudate.  Nasal congestion  Eyes: Conjunctivae and EOM are normal. Pupils are equal, round, and reactive to light. Right eye exhibits no discharge. Left eye exhibits no discharge. No scleral icterus.  Up-to-date on eye exams  Neck: Normal range of motion. Neck supple. No thyromegaly present.  No  bruits thyromegaly or adenopathy  Cardiovascular: Normal rate, regular rhythm and intact distal pulses.   No murmur heard. The heart was regular at 72/m  Pulmonary/Chest: Effort normal and breath sounds normal. No respiratory distress. He has no wheezes. He has no rales. He exhibits no tenderness.  Abdominal: Soft. Bowel sounds are normal. He exhibits no mass. There is no tenderness. There is no rebound and no guarding.  The abdomen is obese without tenderness masses liver or spleen enlargement.  Musculoskeletal: Normal range of motion. He exhibits no edema or tenderness.  Lymphadenopathy:    He has no cervical adenopathy.  Neurological: He is alert and oriented to person, place, and time. He has normal reflexes. No cranial nerve deficit.  Skin: Skin is warm and dry. No rash noted.  Psychiatric: He has a normal mood and affect. His behavior is normal. Judgment and thought content normal.  Nursing note and vitals reviewed.  BP 98/53 mmHg  Pulse 81  Temp(Src) 97.2 F (36.2 C) (Oral)  Ht 5' 11"  (1.803 m)  Wt 251 lb (113.853 kg)  BMI 35.02 kg/m2  Repeat blood pressure with a large adult cuff in the right arm sitting was 110/76      Assessment & Plan:  1. Type 2 diabetes mellitus without complication, with long-term current use of insulin (HCC) -Continue to follow-up with endocrinologist - CBC with Differential/Platelet; Future - Microalbumin / creatinine urine ratio; Future  2. Essential hypertension -The blood pressure was good today with current treatment - BMP8+EGFR; Future - CBC with Differential/Platelet; Future - Microalbumin / creatinine urine ratio; Future - Hepatic function panel; Future  3. Hypothyroidism, unspecified hypothyroidism type -Continue current treatment pending results of lab work - CBC with Differential/Platelet; Future  4. Hyperlipidemia -Continue current treatment pending results of lab work - CBC with Differential/Platelet; Future - NMR,  lipoprofile; Future  5. Vitamin D deficiency -Continue current treatment pending results of lab work - CBC with Differential/Platelet; Future - VITAMIN D 25 Hydroxy (Vit-D Deficiency, Fractures); Future  6. BPH (benign prostatic hyperplasia) -This exam was deferred until the next visit at the request of the patient - CBC with Differential/Platelet; Future  7. Obesity, Class II, BMI 35-39.9, with comorbidity ([place]) -The patient will work more aggressively on exercise and diet and weight loss  8. Erectile dysfunction due to arterial insufficiency -Generic Viagra prescribed  Patient Instructions  Continue current medications. Continue good therapeutic lifestyle changes which include good diet and exercise. Fall precautions discussed with patient. If an FOBT was given today- please return it to our front desk. If you are over 37 years old - you may need Prevnar 59 or the adult Pneumonia vaccine.  **Flu shots are available--- please call and schedule a FLU-CLINIC appointment**  After your visit with [place] today you will receive a survey in the mail or online from [employer] regarding your care with [place]. Please take a moment to fill this out. Your feedback is very important to [place] as you can help  [place] better understand your patient needs as well as improve your experience and satisfaction. WE CARE ABOUT YOU!!!   The patient should continue to follow-up with his endocrinologist He should return the FOBT He should make every effort at following his diet more closely and getting more exercise and work on losing weight. This is very important. Check blood sugars closely Monitor blood pressure when possible When trying the Viagra generic start with the 20 mg and workup to the lowest total strength that is effective and be aware that it could drop your blood pressure    Arrie Senate MD

## 2015-10-26 NOTE — Patient Instructions (Addendum)
Continue current medications. Continue good therapeutic lifestyle changes which include good diet and exercise. Fall precautions discussed with patient. If an FOBT was given today- please return it to our front desk. If you are over 59 years old - you may need Prevnar 13 or the adult Pneumonia vaccine.  **Flu shots are available--- please call and schedule a FLU-CLINIC appointment**  After your visit with [place] today you will receive a survey in the mail or online from [employer] regarding your care with [place]. Please take a moment to fill this out. Your feedback is very important to [place] as you can help [place] better understand your patient needs as well as improve your experience and satisfaction. WE CARE ABOUT YOU!!!   The patient should continue to follow-up with his endocrinologist He should return the FOBT He should make every effort at following his diet more closely and getting more exercise and work on losing weight. This is very important. Check blood sugars closely Monitor blood pressure when possible When trying the Viagra generic start with the 20 mg and workup to the lowest total strength that is effective and be aware that it could drop your blood pressure

## 2015-10-27 ENCOUNTER — Encounter: Payer: Self-pay | Admitting: Family Medicine

## 2015-10-27 ENCOUNTER — Ambulatory Visit (INDEPENDENT_AMBULATORY_CARE_PROVIDER_SITE_OTHER): Payer: BLUE CROSS/BLUE SHIELD | Admitting: Family Medicine

## 2015-10-27 ENCOUNTER — Telehealth: Payer: Self-pay | Admitting: Family Medicine

## 2015-10-27 VITALS — BP 132/66 | HR 95 | Temp 97.2°F | Ht 71.0 in | Wt 248.2 lb

## 2015-10-27 DIAGNOSIS — R52 Pain, unspecified: Secondary | ICD-10-CM | POA: Diagnosis not present

## 2015-10-27 DIAGNOSIS — R6883 Chills (without fever): Secondary | ICD-10-CM

## 2015-10-27 LAB — POCT INFLUENZA A/B
INFLUENZA A, POC: NEGATIVE
Influenza B, POC: NEGATIVE

## 2015-10-27 NOTE — Patient Instructions (Signed)
Great to meet you!  If you have any concerns please come back or call.   Viral Infections A viral infection can be caused by different types of viruses.Most viral infections are not serious and resolve on their own. However, some infections may cause severe symptoms and may lead to further complications. SYMPTOMS Viruses can frequently cause:  Minor sore throat.  Aches and pains.  Headaches.  Runny nose.  Different types of rashes.  Watery eyes.  Tiredness.  Cough.  Loss of appetite.  Gastrointestinal infections, resulting in nausea, vomiting, and diarrhea. These symptoms do not respond to antibiotics because the infection is not caused by bacteria. However, you might catch a bacterial infection following the viral infection. This is sometimes called a "superinfection." Symptoms of such a bacterial infection may include:  Worsening sore throat with pus and difficulty swallowing.  Swollen neck glands.  Chills and a high or persistent fever.  Severe headache.  Tenderness over the sinuses.  Persistent overall ill feeling (malaise), muscle aches, and tiredness (fatigue).  Persistent cough.  Yellow, green, or brown mucus production with coughing. HOME CARE INSTRUCTIONS   Only take over-the-counter or prescription medicines for pain, discomfort, diarrhea, or fever as directed by your caregiver.  Drink enough water and fluids to keep your urine clear or pale yellow. Sports drinks can provide valuable electrolytes, sugars, and hydration.  Get plenty of rest and maintain proper nutrition. Soups and broths with crackers or rice are fine. SEEK IMMEDIATE MEDICAL CARE IF:   You have severe headaches, shortness of breath, chest pain, neck pain, or an unusual rash.  You have uncontrolled vomiting, diarrhea, or you are unable to keep down fluids.  You or your child has an oral temperature above 102 F (38.9 C), not controlled by medicine.  Your baby is older than 3  months with a rectal temperature of 102 F (38.9 C) or higher.  Your baby is 31 months old or younger with a rectal temperature of 100.4 F (38 C) or higher. MAKE SURE YOU:   Understand these instructions.  Will watch your condition.  Will get help right away if you are not doing well or get worse.   This information is not intended to replace advice given to you by your health care provider. Make sure you discuss any questions you have with your health care provider.   Document Released: 06/06/2005 Document Revised: 11/19/2011 Document Reviewed: 02/02/2015 Elsevier Interactive Patient Education [employer]

## 2015-10-27 NOTE — Progress Notes (Signed)
   HPI  Patient presents today here with fluess.  Patient explains that statring this am he had severe onset of chills, diarrhea, and body aches.  He is breathing normally and has only a mild cough  H eis tolerating food and fluids normally He has twin 59 year old grandsons who were tested to be flu positive last week and he had exposure all weekend to them.    PMH: Smoking status noted ROS: Per HPI  Objective: BP 132/66 mmHg  Pulse 95  Temp(Src) 97.2 F (36.2 C) (Oral)  Ht  (1.803 m)  Wt 248 lb 3.2 oz (112.583 kg)  BMI 34.63 kg/m2 Gen: NAD, alert, cooperative with exam HEENT: NCAT, nares clear, TMs WNL BL, oropharynx clear CV: RRR, good S1/S2, no murmur Resp: CTABL, no wheezes, non-labored Ext: No edema, warm Neuro: Alert and oriented, No gross deficits  Assessment and plan:  # Viral respiratory illness Discussed supportive care Considering his recent flu exposure I offered treatment with Tamiflu versus prophylactic treatment which he declines. RTC as needed with any concerns   Rapid flu negative  Murtis Sink, MD [facility] 10/27/2015, 3:38 PM

## 2015-10-27 NOTE — Telephone Encounter (Signed)
Patient states that he is having body aches and chills. Patient advised he would need to be seen so we could test him for the flu. Appointment given for today at 3:25 with [facility].

## 2015-10-28 ENCOUNTER — Encounter: Payer: Self-pay | Admitting: *Deleted

## 2015-11-11 DIAGNOSIS — G4733 Obstructive sleep apnea (adult) (pediatric): Secondary | ICD-10-CM | POA: Diagnosis not present

## 2015-11-29 ENCOUNTER — Other Ambulatory Visit: Payer: Self-pay | Admitting: Family Medicine

## 2015-12-12 DIAGNOSIS — G4733 Obstructive sleep apnea (adult) (pediatric): Secondary | ICD-10-CM | POA: Diagnosis not present

## 2015-12-16 ENCOUNTER — Other Ambulatory Visit: Payer: Self-pay | Admitting: Family Medicine

## 2015-12-20 ENCOUNTER — Other Ambulatory Visit: Payer: Self-pay | Admitting: *Deleted

## 2015-12-20 MED ORDER — INSULIN NPH (HUMAN) (ISOPHANE) 100 UNIT/ML ~~LOC~~ SUSP: SUBCUTANEOUS | Status: DC

## 2015-12-28 DIAGNOSIS — E119 Type 2 diabetes mellitus without complications: Secondary | ICD-10-CM | POA: Diagnosis not present

## 2015-12-28 DIAGNOSIS — E039 Hypothyroidism, unspecified: Secondary | ICD-10-CM | POA: Diagnosis not present

## 2016-01-09 DIAGNOSIS — E119 Type 2 diabetes mellitus without complications: Secondary | ICD-10-CM | POA: Diagnosis not present

## 2016-01-09 DIAGNOSIS — E559 Vitamin D deficiency, unspecified: Secondary | ICD-10-CM | POA: Diagnosis not present

## 2016-01-09 DIAGNOSIS — E785 Hyperlipidemia, unspecified: Secondary | ICD-10-CM | POA: Diagnosis not present

## 2016-01-09 DIAGNOSIS — E039 Hypothyroidism, unspecified: Secondary | ICD-10-CM | POA: Diagnosis not present

## 2016-02-03 DIAGNOSIS — G4733 Obstructive sleep apnea (adult) (pediatric): Secondary | ICD-10-CM | POA: Diagnosis not present

## 2016-02-08 ENCOUNTER — Other Ambulatory Visit: Payer: Self-pay

## 2016-02-08 MED ORDER — ATORVASTATIN CALCIUM 80 MG PO TABS: 80.0000 mg | ORAL_TABLET | Freq: Every day | ORAL | Status: DC

## 2016-02-27 DIAGNOSIS — E669 Obesity, unspecified: Secondary | ICD-10-CM | POA: Diagnosis not present

## 2016-02-27 DIAGNOSIS — Z6835 Body mass index (BMI) 35.0-35.9, adult: Secondary | ICD-10-CM | POA: Diagnosis not present

## 2016-02-29 ENCOUNTER — Ambulatory Visit (INDEPENDENT_AMBULATORY_CARE_PROVIDER_SITE_OTHER): Payer: BLUE CROSS/BLUE SHIELD | Admitting: Family Medicine

## 2016-02-29 ENCOUNTER — Encounter: Payer: Self-pay | Admitting: Family Medicine

## 2016-02-29 VITALS — BP 130/75 | HR 91 | Temp 97.3°F | Ht 71.0 in | Wt 255.4 lb

## 2016-02-29 DIAGNOSIS — N400 Enlarged prostate without lower urinary tract symptoms: Secondary | ICD-10-CM

## 2016-02-29 DIAGNOSIS — E039 Hypothyroidism, unspecified: Secondary | ICD-10-CM | POA: Diagnosis not present

## 2016-02-29 DIAGNOSIS — Z1211 Encounter for screening for malignant neoplasm of colon: Secondary | ICD-10-CM

## 2016-02-29 DIAGNOSIS — E785 Hyperlipidemia, unspecified: Secondary | ICD-10-CM

## 2016-02-29 DIAGNOSIS — I10 Essential (primary) hypertension: Secondary | ICD-10-CM | POA: Diagnosis not present

## 2016-02-29 DIAGNOSIS — E559 Vitamin D deficiency, unspecified: Secondary | ICD-10-CM

## 2016-02-29 DIAGNOSIS — E118 Type 2 diabetes mellitus with unspecified complications: Secondary | ICD-10-CM

## 2016-02-29 LAB — BAYER DCA HB A1C WAIVED: HB A1C (BAYER DCA - WAIVED): 7.8 % — ABNORMAL HIGH (ref <7.0)

## 2016-02-29 MED ORDER — ATORVASTATIN CALCIUM 80 MG PO TABS: 80.0000 mg | ORAL_TABLET | Freq: Every day | ORAL | Status: DC

## 2016-02-29 MED ORDER — RAMIPRIL 5 MG PO CAPS: ORAL_CAPSULE | ORAL | Status: DC

## 2016-02-29 MED ORDER — NIACIN ER (ANTIHYPERLIPIDEMIC) 500 MG PO TBCR: EXTENDED_RELEASE_TABLET | ORAL | Status: DC

## 2016-02-29 MED ORDER — OMEGA-3-ACID ETHYL ESTERS 1 G PO CAPS: 2.0000 g | ORAL_CAPSULE | Freq: Two times a day (BID) | ORAL | Status: DC

## 2016-02-29 MED ORDER — RANITIDINE HCL 150 MG PO TABS: 150.0000 mg | ORAL_TABLET | Freq: Two times a day (BID) | ORAL | Status: DC

## 2016-02-29 MED ORDER — LEVOTHYROXINE SODIUM 175 MCG PO TABS: ORAL_TABLET | ORAL | Status: DC

## 2016-02-29 MED ORDER — SILDENAFIL CITRATE 20 MG PO TABS: ORAL_TABLET | ORAL | Status: DC

## 2016-02-29 MED ORDER — INSULIN NPH (HUMAN) (ISOPHANE) 100 UNIT/ML ~~LOC~~ SUSP: SUBCUTANEOUS | Status: DC

## 2016-02-29 NOTE — Addendum Note (Signed)
Addended by: Orma RenderHODGES, Lenda Baratta F on: 02/29/2016 04:52 PM   Modules accepted: Orders

## 2016-02-29 NOTE — Patient Instructions (Signed)
Continue to follow-up with endocrinology for better blood sugar control Follow-up aggressive therapeutic lifestyle changes which include diet and exercise and make every effort to lose weight. Drink plenty of fluids especially water. Get yearly eye exams Return the FOBT We will call with lab work as soon as it becomes available.

## 2016-02-29 NOTE — Progress Notes (Signed)
Subjective:    Patient ID: Joseph Kirby, male    DOB: [birth year], 60 y.o.   MRN: [id number]  HPI  Patient is here today for a follow up on hyperlipidemia, hypothyroidism, hypertension, and diabetes. Patient has no other complaints today. The patient is doing well overall. He is followed by endocrinologist in [place] for his insulin-dependent diabetes. His last chest x-ray was in June 2016 and he is due to be given an FOBT today. He will be due a colonoscopy and a couple of years. He'll get lab work today including a hemoglobin A1c. The patient denies any chest pain pressure tightness or palpitations. He does not have any shortness of breath. He swallowing his food without problems and having no heartburn indigestion nausea vomiting diarrhea or blood in the stool that he is aware of. He only takes Zantac or ranitidine as needed. Passing his water without problems. He has no joint complaints. He gets his eyes examined in the fall. He is followed regularly by endocrinologist at the [facility] in [place] for his diabetes and he says his last A1c was over 8. Insulin was adjusted and he thinks his blood sugars have been doing better. The patient is pleasant and relaxed and calm as usual. His body mass index is 34.7.  Review of Systems  Constitutional: Negative.   HENT: Negative.   Eyes: Negative.   Respiratory: Negative.   Cardiovascular: Negative.   Gastrointestinal: Negative.   Endocrine: Negative.   Genitourinary: Negative.   Musculoskeletal: Negative.   Skin: Negative.   Allergic/Immunologic: Negative.   Neurological: Negative.   Hematological: Negative.   Psychiatric/Behavioral: Negative.         Patient Active Problem List   Diagnosis Date Noted  . Diabetes, insulin-dependent 06/20/2012  . CAD (coronary artery disease)   . Hypertension   . Hypothyroidism   . Ejection fraction   . DKA (diabetic ketoacidoses), history of 03/17/2012  . BPH (benign prostatic  hyperplasia)   . Colon polyp   . DYSLIPIDEMIA 03/27/2010   Outpatient Encounter Prescriptions as of 02/29/2016  Medication Sig  . aspirin 81 MG tablet Take 81 mg by mouth daily.    Marland Kitchen atorvastatin (LIPITOR) 80 MG tablet Take 1 tablet (80 mg total) by mouth daily.  . B-D INS SYR ULTRAFINE 1CC/30G 30G X 1/2" 1 ML MISC USE AS DIRECTED THREE TIMES A DAY  . Cholecalciferol (VITAMIN D) 2000 UNITS CAPS Take 2 capsules by mouth daily.  Marland Kitchen glucose blood (ONE TOUCH ULTRA TEST) test strip FSBS bid  . HUMULIN R 100 UNIT/ML injection Inject 0.1 mLs (10 Units  total) into the skin 2  (two) times daily before a  meal.  . insulin NPH Human (HUMULIN N) 100 UNIT/ML injection Inject 50u in am, 40u in pm as directed  . Lancets (ONETOUCH ULTRASOFT) lancets Use as instructed  . NIASPAN 500 MG CR tablet Take 3 tablets by mouth at  bedtime  . nystatin-triamcinolone (MYCOLOG II) cream Apply 1 application topically 2 (two) times daily.  Marland Kitchen omega-3 acid ethyl esters (LOVAZA) 1 G capsule Take 2 capsules (2 g total) by mouth 2 (two) times daily.  . ramipril (ALTACE) 5 MG capsule Take 1 capsule by mouth  daily  . ranitidine (ZANTAC) 150 MG tablet Take 150 mg by mouth 2 (two) times daily.  . sildenafil (REVATIO) 20 MG tablet Take 2-5 tabs PRN prior to Sexual activity.  Marland Kitchen SYNTHROID 175 MCG tablet Take 1 tablet daily.None on sundays.  . [DISCONTINUED] niacin (NIASPAN)  500 MG CR tablet Take 3 tablets (1,500 mg total) by mouth at bedtime.   No facility-administered encounter medications on file as of 02/29/2016.       Objective:   Physical Exam  Constitutional: He is oriented to person, place, and time. He appears well-developed and well-nourished. No distress.  The patient is calm and pleasant and relaxed as usual  HENT:  Head: Normocephalic and atraumatic.  Right Ear: External ear normal.  Left Ear: External ear normal.  Nose: Nose normal.  Mouth/Throat: Oropharynx is clear and moist. No oropharyngeal exudate.  Eyes:  Conjunctivae and EOM are normal. Pupils are equal, round, and reactive to light. Right eye exhibits no discharge. Left eye exhibits no discharge. No scleral icterus.  Neck: Normal range of motion. Neck supple. No thyromegaly present.  No bruits thyromegaly or anterior cervical adenopathy  Cardiovascular: Normal rate, regular rhythm, normal heart sounds and intact distal pulses.   No murmur heard. Heart has a regular rate and rhythm at 72/m  Pulmonary/Chest: Effort normal and breath sounds normal. No respiratory distress. He has no wheezes. He has no rales. He exhibits no tenderness.  Clear anteriorly and posteriorly and no axillary adenopathy  Abdominal: Soft. Bowel sounds are normal. He exhibits no mass. There is no tenderness. There is no rebound and no guarding.  Abdominal obesity without liver or spleen enlargement or bruits organ enlargement.  Genitourinary: Rectum normal and penis normal.  The prostate is slightly enlarged without any lumps or masses. There are no rectal masses. There are no inguinal hernias palpable. The external genitalia were within normal limits.  Musculoskeletal: Normal range of motion. He exhibits no edema or tenderness.  Slight pretibial edema above the sock line bilaterally.  Lymphadenopathy:    He has no cervical adenopathy.  Neurological: He is alert and oriented to person, place, and time. He has normal reflexes. No cranial nerve deficit.  Skin: Skin is warm and dry. No rash noted.  Psychiatric: He has a normal mood and affect. His behavior is normal. Judgment and thought content normal.  Nursing note and vitals reviewed.  BP 130/75 mmHg  Pulse 91  Temp(Src) 97.3 F (36.3 C) (Oral)  Ht _0  (1.803 m)  Wt 255 lb 6.4 oz (115.849 kg)  BMI 35.64 kg/m2        Assessment & Plan:  1. Hypothyroidism, unspecified hypothyroidism type -Continue current treatment pending results of lab work - Thyroid Panel With TSH  2. Essential hypertension -The patient  should continue to watch sodium intake and continue with his current blood pressure treatment and continue with weight loss efforts - BMP8+EGFR - CBC with Differential/Platelet  3. Hyperlipidemia -Continue with atorvastatin and aggressive therapeutic lifestyle changes which include diet and exercise pending results of lab work - Hepatic function panel - Lipid panel  4. Vitamin D deficiency -Continue vitamin D replacement pending results of lab work - VITAMIN D 25 Hydroxy (Vit-D Deficiency, Fractures)  5. Special screening for malignant neoplasms, colon - Fecal occult blood, imunochemical; Future  6. Type 2 diabetes mellitus without complication with long-term use of insulin -Continue to follow-up with the [facility] clinic and the endocrinologist to get blood sugar under better control. - Bayer DCA Hb A1c Waived - Microalbumin / creatinine urine ratio  7. BPH (benign prostatic hyperplasia) -The patient is having no symptoms with his BPH - PSA, total and free  No orders of the defined types were placed in this encounter.   Patient Instructions  Continue to follow-up with  endocrinology for better blood sugar control Follow-up aggressive therapeutic lifestyle changes which include diet and exercise and make every effort to lose weight. Drink plenty of fluids especially water. Get yearly eye exams Return the FOBT We will call with lab work as soon as it becomes available.    Arrie Senate MD

## 2016-02-29 NOTE — Addendum Note (Signed)
Addended by: Tamera PuntWRAY, WENDY S on: 02/29/2016 04:51 PM   Modules accepted: Orders

## 2016-03-01 LAB — HEPATIC FUNCTION PANEL
ALBUMIN: 3.9 g/dL (ref 3.5–5.5)
ALT: 25 IU/L (ref 0–44)
AST: 29 IU/L (ref 0–40)
Alkaline Phosphatase: 107 IU/L (ref 39–117)
Bilirubin Total: 0.3 mg/dL (ref 0.0–1.2)
Bilirubin, Direct: 0.14 mg/dL (ref 0.00–0.40)
Total Protein: 6.2 g/dL (ref 6.0–8.5)

## 2016-03-01 LAB — THYROID PANEL WITH TSH
FREE THYROXINE INDEX: 1.7 (ref 1.2–4.9)
T3 UPTAKE RATIO: 32 % (ref 24–39)
T4, Total: 5.2 ug/dL (ref 4.5–12.0)
TSH: 1.41 u[IU]/mL (ref 0.450–4.500)

## 2016-03-01 LAB — CBC WITH DIFFERENTIAL/PLATELET
Basophils Absolute: 0 10*3/uL (ref 0.0–0.2)
Basos: 1 %
EOS (ABSOLUTE): 0.1 10*3/uL (ref 0.0–0.4)
EOS: 2 %
HEMATOCRIT: 41.1 % (ref 37.5–51.0)
Hemoglobin: 14.1 g/dL (ref 12.6–17.7)
Immature Grans (Abs): 0 10*3/uL (ref 0.0–0.1)
Immature Granulocytes: 0 %
LYMPHS ABS: 1 10*3/uL (ref 0.7–3.1)
Lymphs: 18 %
MCH: 28.4 pg (ref 26.6–33.0)
MCHC: 34.3 g/dL (ref 31.5–35.7)
MCV: 83 fL (ref 79–97)
MONOS ABS: 0.5 10*3/uL (ref 0.1–0.9)
Monocytes: 9 %
NEUTROS PCT: 70 %
Neutrophils Absolute: 4 10*3/uL (ref 1.4–7.0)
PLATELETS: 246 10*3/uL (ref 150–379)
RBC: 4.97 x10E6/uL (ref 4.14–5.80)
RDW: 13.7 % (ref 12.3–15.4)
WBC: 5.6 10*3/uL (ref 3.4–10.8)

## 2016-03-01 LAB — PSA, TOTAL AND FREE
PROSTATE SPECIFIC AG, SERUM: 0.5 ng/mL (ref 0.0–4.0)
PSA, Free Pct: 22 %
PSA, Free: 0.11 ng/mL

## 2016-03-01 LAB — LIPID PANEL
Chol/HDL Ratio: 3 ratio units (ref 0.0–5.0)
Cholesterol, Total: 147 mg/dL (ref 100–199)
HDL: 49 mg/dL (ref >39)
LDL Calculated: 77 mg/dL (ref 0–99)
TRIGLYCERIDES: 107 mg/dL (ref 0–149)
VLDL Cholesterol Cal: 21 mg/dL (ref 5–40)

## 2016-03-01 LAB — MICROALBUMIN / CREATININE URINE RATIO
CREATININE, UR: 156.4 mg/dL
MICROALB/CREAT RATIO: 2.2 mg/g{creat} (ref 0.0–30.0)
Microalbumin, Urine: 3.4 ug/mL

## 2016-03-01 LAB — BMP8+EGFR
BUN / CREAT RATIO: 16 (ref 9–20)
BUN: 17 mg/dL (ref 6–24)
CO2: 23 mmol/L (ref 18–29)
CREATININE: 1.08 mg/dL (ref 0.76–1.27)
Calcium: 9.3 mg/dL (ref 8.7–10.2)
Chloride: 100 mmol/L (ref 96–106)
GFR calc Af Amer: 86 mL/min/{1.73_m2} (ref >59)
GFR calc non Af Amer: 75 mL/min/{1.73_m2} (ref >59)
GLUCOSE: 161 mg/dL — AB (ref 65–99)
POTASSIUM: 4 mmol/L (ref 3.5–5.2)
SODIUM: 140 mmol/L (ref 134–144)

## 2016-03-01 LAB — VITAMIN D 25 HYDROXY (VIT D DEFICIENCY, FRACTURES): Vit D, 25-Hydroxy: 47.2 ng/mL (ref 30.0–100.0)

## 2016-03-04 LAB — FECAL OCCULT BLOOD, IMMUNOCHEMICAL: FECAL OCCULT BLD: NEGATIVE

## 2016-03-05 DIAGNOSIS — G4733 Obstructive sleep apnea (adult) (pediatric): Secondary | ICD-10-CM | POA: Diagnosis not present

## 2016-03-12 ENCOUNTER — Telehealth: Payer: Self-pay

## 2016-03-12 NOTE — Telephone Encounter (Signed)
Please make sure that the complaint was erectile dysfunction and BPH and diabetes

## 2016-03-12 NOTE — Telephone Encounter (Signed)
Insurance denied Sildenafil

## 2016-04-04 DIAGNOSIS — E669 Obesity, unspecified: Secondary | ICD-10-CM | POA: Diagnosis not present

## 2016-04-04 DIAGNOSIS — Z6835 Body mass index (BMI) 35.0-35.9, adult: Secondary | ICD-10-CM | POA: Diagnosis not present

## 2016-04-16 DIAGNOSIS — G4733 Obstructive sleep apnea (adult) (pediatric): Secondary | ICD-10-CM | POA: Diagnosis not present

## 2016-04-23 ENCOUNTER — Other Ambulatory Visit: Payer: Self-pay | Admitting: Family Medicine

## 2016-04-23 DIAGNOSIS — E559 Vitamin D deficiency, unspecified: Secondary | ICD-10-CM | POA: Diagnosis not present

## 2016-04-23 DIAGNOSIS — Z6835 Body mass index (BMI) 35.0-35.9, adult: Secondary | ICD-10-CM | POA: Diagnosis not present

## 2016-04-23 DIAGNOSIS — E119 Type 2 diabetes mellitus without complications: Secondary | ICD-10-CM | POA: Diagnosis not present

## 2016-04-23 DIAGNOSIS — E669 Obesity, unspecified: Secondary | ICD-10-CM | POA: Diagnosis not present

## 2016-05-02 ENCOUNTER — Other Ambulatory Visit: Payer: Self-pay | Admitting: Family Medicine

## 2016-05-03 ENCOUNTER — Other Ambulatory Visit: Payer: Self-pay | Admitting: Family Medicine

## 2016-05-03 MED ORDER — GLUCOSE BLOOD VI STRP: ORAL_STRIP | 1 refills | Status: DC

## 2016-05-03 MED ORDER — ONETOUCH ULTRASOFT LANCETS MISC: 1 refills | Status: DC

## 2016-05-03 NOTE — Telephone Encounter (Signed)
done

## 2016-05-09 ENCOUNTER — Encounter: Payer: Self-pay | Admitting: Family Medicine

## 2016-05-09 ENCOUNTER — Ambulatory Visit (INDEPENDENT_AMBULATORY_CARE_PROVIDER_SITE_OTHER): Payer: BLUE CROSS/BLUE SHIELD | Admitting: Family Medicine

## 2016-05-09 ENCOUNTER — Ambulatory Visit (INDEPENDENT_AMBULATORY_CARE_PROVIDER_SITE_OTHER): Payer: BLUE CROSS/BLUE SHIELD

## 2016-05-09 VITALS — BP 116/63 | HR 78 | Temp 97.7°F | Ht 71.0 in | Wt 255.0 lb

## 2016-05-09 DIAGNOSIS — M25561 Pain in right knee: Secondary | ICD-10-CM

## 2016-05-09 DIAGNOSIS — E669 Obesity, unspecified: Secondary | ICD-10-CM | POA: Diagnosis not present

## 2016-05-09 DIAGNOSIS — Z6835 Body mass index (BMI) 35.0-35.9, adult: Secondary | ICD-10-CM | POA: Diagnosis not present

## 2016-05-09 MED ORDER — DICLOFENAC SODIUM 75 MG PO TBEC: 75.0000 mg | DELAYED_RELEASE_TABLET | Freq: Two times a day (BID) | ORAL | 2 refills | Status: DC

## 2016-05-09 NOTE — Progress Notes (Signed)
Subjective:  Patient ID: Joseph Kirby, male    DOB: Jan 11, 1956  Age: 60 y.o. MRN: [id number]  CC: Knee Pain (Right, started on Monday, [facility])   HPI Joseph Kirby presents for Onset 2 days ago of worsening knee pain. The pain is on the right anteromedial joint line there is no known injury. Pain is moderately severe. It is waxing and waning but at least moderate at all times. It has not interrupted sleep. It has not responded to rest or heat.   History Joseph Kirby has a past medical history of BPH (benign prostatic hyperplasia); CAD (coronary artery disease); Colon polyp; Diabetes mellitus; Ejection fraction; Hyperlipidemia; Hypothyroidism; and Sleep apnea.   He has a past surgical history that includes Cardiac catheterization (2003) and Nasal septum surgery.   His family history includes Diabetes in his sister; Heart disease in his father.He reports that he quit smoking about 23 years ago. His smoking use included Cigarettes. He has never used smokeless tobacco. He reports that he does not drink alcohol or use drugs.    ROS Review of Systems  Constitutional: Negative for chills, diaphoresis and fever.  HENT: Negative for sore throat.   Cardiovascular: Negative for chest pain.  Gastrointestinal: Negative for abdominal pain.  Musculoskeletal: Positive for arthralgias, gait problem and myalgias. Negative for back pain and neck pain.  Skin: Negative for rash.  Neurological: Positive for weakness. Negative for numbness.    Objective:  BP 116/63 (BP Location: Left Arm, Patient Position: Sitting, Cuff Size: Large)   Pulse 78   Temp 97.7 F (36.5 C) (Oral)   Ht 5\' 11"  (1.803 m)   Wt 255 lb (115.7 kg)   SpO2 96%   BMI 35.57 kg/m   BP Readings from Last 3 Encounters:  05/09/16 116/63  02/29/16 130/75  10/27/15 132/66    Wt Readings from Last 3 Encounters:  05/09/16 255 lb (115.7 kg)  02/29/16 255 lb 6.4 oz (115.8 kg)  10/27/15 248 lb 3.2 oz (112.6 kg)     Physical Exam    Constitutional: He is oriented to person, place, and time. He appears well-developed and well-nourished.  HENT:  Head: Normocephalic and atraumatic.  Right Ear: External ear normal.  Left Ear: External ear normal.  Mouth/Throat: No oropharyngeal exudate or posterior oropharyngeal erythema.  Eyes: Pupils are equal, round, and reactive to light.  Neck: Normal range of motion. Neck supple.  Cardiovascular: Normal rate and regular rhythm.   No murmur heard. Pulmonary/Chest: Breath sounds normal. No respiratory distress.  Musculoskeletal:  Theright  knee has full range of motion with moderate discomfort actively and passively. Gait is with a limp. The joint lines are nontender. The patella is palpable without tenderness or edema.  Lachman / anterior drawer signs are negative for signs of instability and pain free. McMurray testing reveals no pop or excessive discomfort. Varus and valgus stree maneuvers do not cause ligamentous stretch or instability.   Neurological: He is alert and oriented to person, place, and time.  Vitals reviewed.    Lab Results  Component Value Date   WBC 5.6 02/29/2016   HGB 15.2 09/09/2014   HCT 41.1 02/29/2016   PLT 246 02/29/2016   GLUCOSE 161 (H) 02/29/2016   CHOL 147 02/29/2016   TRIG 107 02/29/2016   HDL 49 02/29/2016   LDLCALC 77 02/29/2016   ALT 25 02/29/2016   AST 29 02/29/2016   NA 140 02/29/2016   K 4.0 02/29/2016   CL 100 02/29/2016   CREATININE 1.08 02/29/2016  BUN 17 02/29/2016   CO2 23 02/29/2016   TSH 1.410 02/29/2016   PSA 0.4 09/09/2014   HGBA1C 8.2 09/22/2015    No results found.  Assessment & Plan:   Joseph Kirby was seen today for knee pain.  Diagnoses and all orders for this visit:  Right knee pain -     DG Knee 1-2 Views Right; Future -     DME Other see comment  Other orders -     diclofenac (VOLTAREN) 75 MG EC tablet; Take 1 tablet (75 mg total) by mouth 2 (two) times daily. For muscle and  Joint pain    X-ray shows  evidence for arthritis. He should wear a brace which was prescribed. Apply heat and perform exercises. If symptoms not relieved well over the next couple of weeks will need to refer him to physical therapy.  I am having Joseph Kirby start on diclofenac. I am also having him maintain his aspirin, Vitamin D, nystatin-triamcinolone, HUMULIN R, levothyroxine, sildenafil, ramipril, omega-3 acid ethyl esters, niacin, insulin NPH Human, atorvastatin, ranitidine, B-D INS SYR ULTRAFINE 1CC/30G, onetouch ultrasoft, and glucose blood.  Meds ordered this encounter  Medications  . diclofenac (VOLTAREN) 75 MG EC tablet    Sig: Take 1 tablet (75 mg total) by mouth 2 (two) times daily. For muscle and  Joint pain    Dispense:  60 tablet    Refill:  2     Follow-up: Return in about 2 weeks (around 05/23/2016).  Mechele ClaudeWarren Jeree Delcid, M.D.

## 2016-05-29 NOTE — Progress Notes (Signed)
Cardiology Office Note   Date:  05/29/2016   ID:  Joseph Kirby, DOB 05/21/1956, MRN [id number]  PCP:  Rudi HeapMOORE, DONALD, MD  Cardiologist:   Rollene RotundaJames Kamauri Denardo, MD   No chief complaint on file.   History of Present Illness: Joseph Kirby is a 60 y.o. male who presents for evaluation of coronary disease and multiple cardiovascular risk factors. He had a history in 2012 elevated cardiac catheterization and was found to have 30% disease and all of his major coronary distributions. He had a stress test in 2013 was negative for any evidence of ischemia on imaging.  He had a negative POET (Plain Old Exercise Treadmill) in Dec of last year.   Since I last saw him he has done well.  The patient denies any new symptoms such as chest discomfort, neck or arm discomfort. There has been no new shortness of breath, PND or orthopnea. There have been no reported palpitations, presyncope or syncope.  Past Medical History:  Diagnosis Date  . BPH (benign prostatic hyperplasia)   . CAD (coronary artery disease)    Nonobstructive disease by catheterization, 2003  . Colon polyp   . Diabetes mellitus   . Ejection fraction    EF 55%, catheterization, 2003  . Hyperlipidemia   . Hypothyroidism   . Sleep apnea     Past Surgical History:  Procedure Laterality Date  . CARDIAC CATHETERIZATION  2003   NONOBSTRUCTIVE, EF 55%  . NASAL SEPTUM SURGERY       Current Outpatient Prescriptions  Medication Sig Dispense Refill  . aspirin 81 MG tablet Take 81 mg by mouth daily.      Marland Kitchen. atorvastatin (LIPITOR) 80 MG tablet Take 1 tablet (80 mg total) by mouth daily. 90 tablet 1  . B-D INS SYR ULTRAFINE 1CC/30G 30G X 1/2" 1 ML MISC Use as directed three times a day 270 each 1  . Cholecalciferol (VITAMIN D) 2000 UNITS CAPS Take 2 capsules by mouth daily.    . diclofenac (VOLTAREN) 75 MG EC tablet Take 1 tablet (75 mg total) by mouth 2 (two) times daily. For muscle and  Joint pain 60 tablet 2  . glucose blood (ONE TOUCH ULTRA  TEST) test strip Test bid. DX E11.9 300 each 1  . HUMULIN R 100 UNIT/ML injection Inject 0.1 mLs (10 Units  total) into the skin 2  (two) times daily before a  meal. 30 mL 1  . insulin NPH Human (HUMULIN N) 100 UNIT/ML injection Inject 50u in am, 40u in pm as directed 90 mL 3  . Lancets (ONETOUCH ULTRASOFT) lancets Test bid DX E11.9 300 each 1  . levothyroxine (SYNTHROID) 175 MCG tablet Take 1 tablet daily.None on sundays. 78 tablet 2  . niacin (NIASPAN) 500 MG CR tablet Take 3 tablets by mouth at  bedtime 270 tablet 1  . nystatin-triamcinolone (MYCOLOG II) cream Apply 1 application topically 2 (two) times daily. 30 g 1  . omega-3 acid ethyl esters (LOVAZA) 1 g capsule Take 2 capsules (2 g total) by mouth 2 (two) times daily. 360 capsule 3  . ramipril (ALTACE) 5 MG capsule Take 1 capsule by mouth  daily 90 capsule 1  . ranitidine (ZANTAC) 150 MG tablet Take 1 tablet by mouth two  times daily 180 tablet 1  . sildenafil (REVATIO) 20 MG tablet Take 2-5 tabs PRN prior to Sexual activity. 50 tablet 2   No current facility-administered medications for this visit.     Allergies:   Ezetimibe-simvastatin;  Iodine; and Shrimp [shellfish allergy]    ROS:  Please see the history of present illness.   Otherwise, review of systems are positive for none.   All other systems are reviewed and negative.    PHYSICAL EXAM: VS:  There were no vitals taken for this visit. , BMI There is no height or weight on file to calculate BMI. GENERAL:  Well appearing NECK:  No jugular venous distention, waveform within normal limits, carotid upstroke brisk and symmetric, no bruits, no thyromegaly LUNGS:  Clear to auscultation bilaterally CHEST:  Unremarkable HEART:  PMI not displaced or sustained,S1 and S2 within normal limits, no S3, no S4, no clicks, no rubs, no murmurs ABD:  Flat, positive bowel sounds normal in frequency in pitch, no bruits, no rebound, no guarding, no midline pulsatile mass, no hepatomegaly, no  splenomegaly EXT:  2 plus pulses throughout, no edema, no cyanosis no clubbing   EKG:  EKG is  ordered today. The ekg ordered today demonstrates sinus rhythm, rate 77, axis within normal limits, intervals within normal limits, no acute ST-T wave changes.   Recent Labs: 02/29/2016: ALT 25; BUN 17; Creatinine, Ser 1.08; Platelets 246; Potassium 4.0; Sodium 140; TSH 1.410    Lipid Panel    Component Value Date/Time   CHOL 147 02/29/2016 1640   CHOL 119 12/25/2012 0952   TRIG 107 02/29/2016 1640   TRIG 77 09/09/2014 1022   TRIG 39 12/25/2012 0952   HDL 49 02/29/2016 1640   HDL 45 09/09/2014 1022   HDL 42 12/25/2012 0952   CHOLHDL 3.0 02/29/2016 1640   LDLCALC 77 02/29/2016 1640   LDLCALC 81 05/18/2014 1025   LDLCALC 69 12/25/2012 0952     Lab Results  Component Value Date   HGBA1C 8.2 09/22/2015     Wt Readings from Last 3 Encounters:  05/09/16 255 lb (115.7 kg)  02/29/16 255 lb 6.4 oz (115.8 kg)  10/27/15 248 lb 3.2 oz (112.6 kg)      Other studies Reviewed: Additional studies/ records that were reviewed today include: POET (Plain Old Exercise Treadmill) Review of the above records demonstrates:  Please see elsewhere in the note.     ASSESSMENT AND PLAN:  CAD:  The patient has no new cardiovascular symptoms.  He had non obstructive disease.  No change in therapy is planned.   OBESITY:  The patient understands the need to lose weight with diet and exercise. We have discussed specific strategies for this.  DYSLIPIDEMIA:  Per Rudi Heap, MD     Current medicines are reviewed at length with the patient today.  The patient does not have concerns regarding medicines.  The following changes have been made:  no change  Labs/ tests ordered today include:   No orders of the defined types were placed in this encounter.    Disposition:   FU with me as needed.      Signed, Rollene Rotunda, MD  05/29/2016 10:02 PM    [facility]

## 2016-05-30 ENCOUNTER — Ambulatory Visit (INDEPENDENT_AMBULATORY_CARE_PROVIDER_SITE_OTHER): Payer: BLUE CROSS/BLUE SHIELD | Admitting: Cardiology

## 2016-05-30 ENCOUNTER — Encounter: Payer: Self-pay | Admitting: Cardiology

## 2016-05-30 VITALS — BP 118/70 | HR 77 | Ht 71.0 in | Wt 251.0 lb

## 2016-05-30 DIAGNOSIS — E669 Obesity, unspecified: Secondary | ICD-10-CM | POA: Diagnosis not present

## 2016-05-30 DIAGNOSIS — I10 Essential (primary) hypertension: Secondary | ICD-10-CM | POA: Diagnosis not present

## 2016-05-30 DIAGNOSIS — Z6835 Body mass index (BMI) 35.0-35.9, adult: Secondary | ICD-10-CM | POA: Diagnosis not present

## 2016-05-30 NOTE — Patient Instructions (Signed)
Medication Instructions:  The current medical regimen is effective;  continue present plan and medications.  Follow-Up: Follow up as needed with Dr Hochrein.  Thank you for choosing [facility]!!     

## 2016-06-12 DIAGNOSIS — G4733 Obstructive sleep apnea (adult) (pediatric): Secondary | ICD-10-CM | POA: Diagnosis not present

## 2016-06-14 DIAGNOSIS — E119 Type 2 diabetes mellitus without complications: Secondary | ICD-10-CM | POA: Diagnosis not present

## 2016-07-01 IMAGING — CR DG KNEE 1-2V*L*
4 series · 4 of 4 positions shown · non-contrast
Comparison: None.

CLINICAL DATA: Pain following fall 1 week prior

EXAM:
LEFT KNEE - 4 VIEW ; STANDING AP KNEES

[view not recorded (1 of 4)]
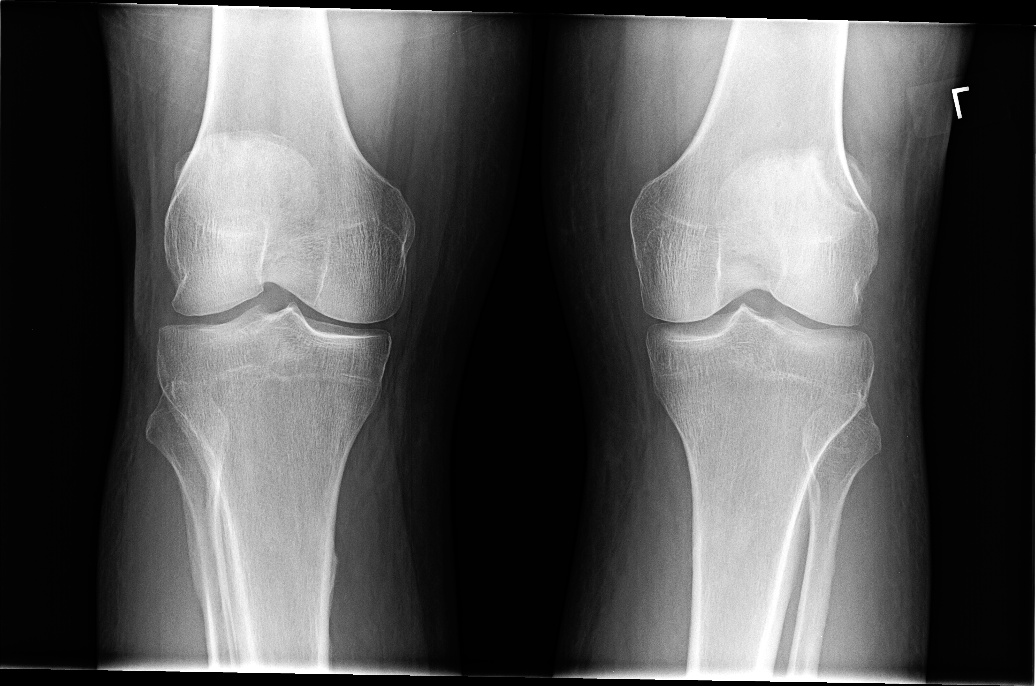

[view not recorded (2 of 4)]
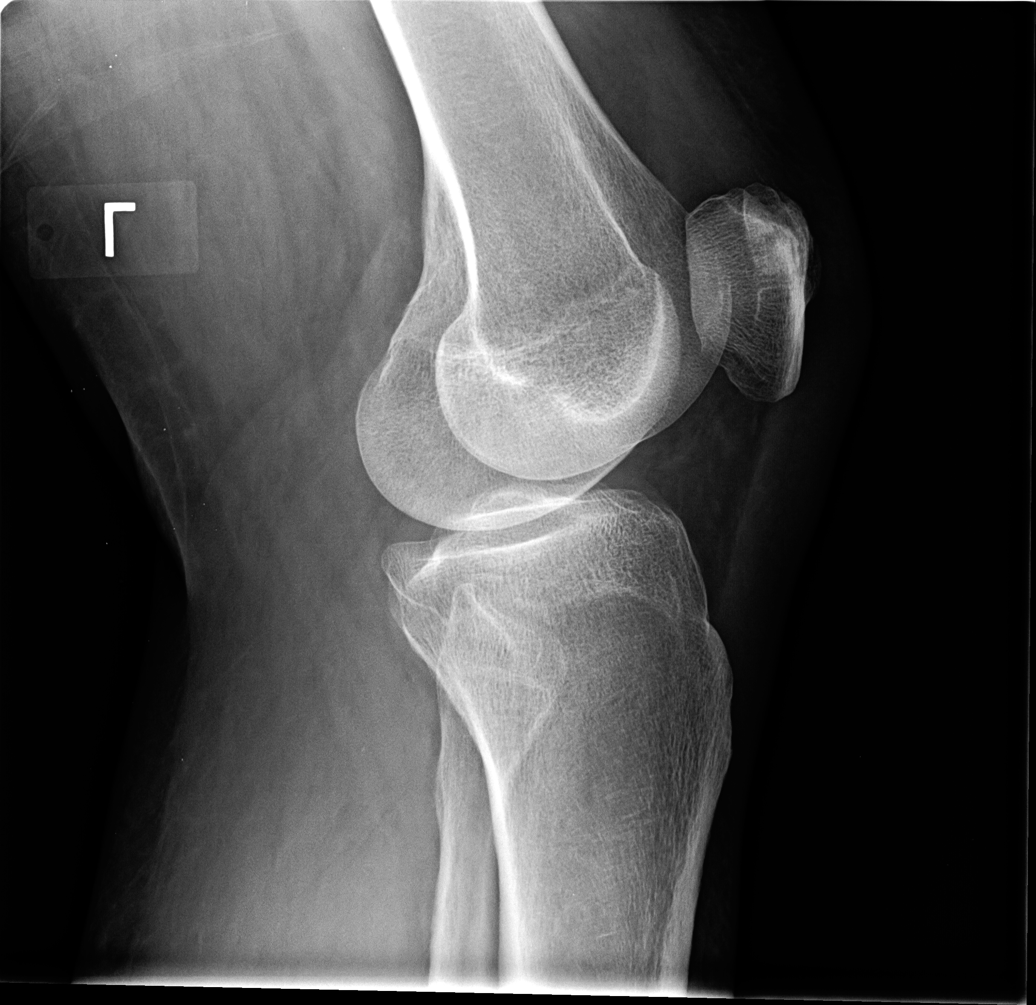

[view not recorded (3 of 4)]
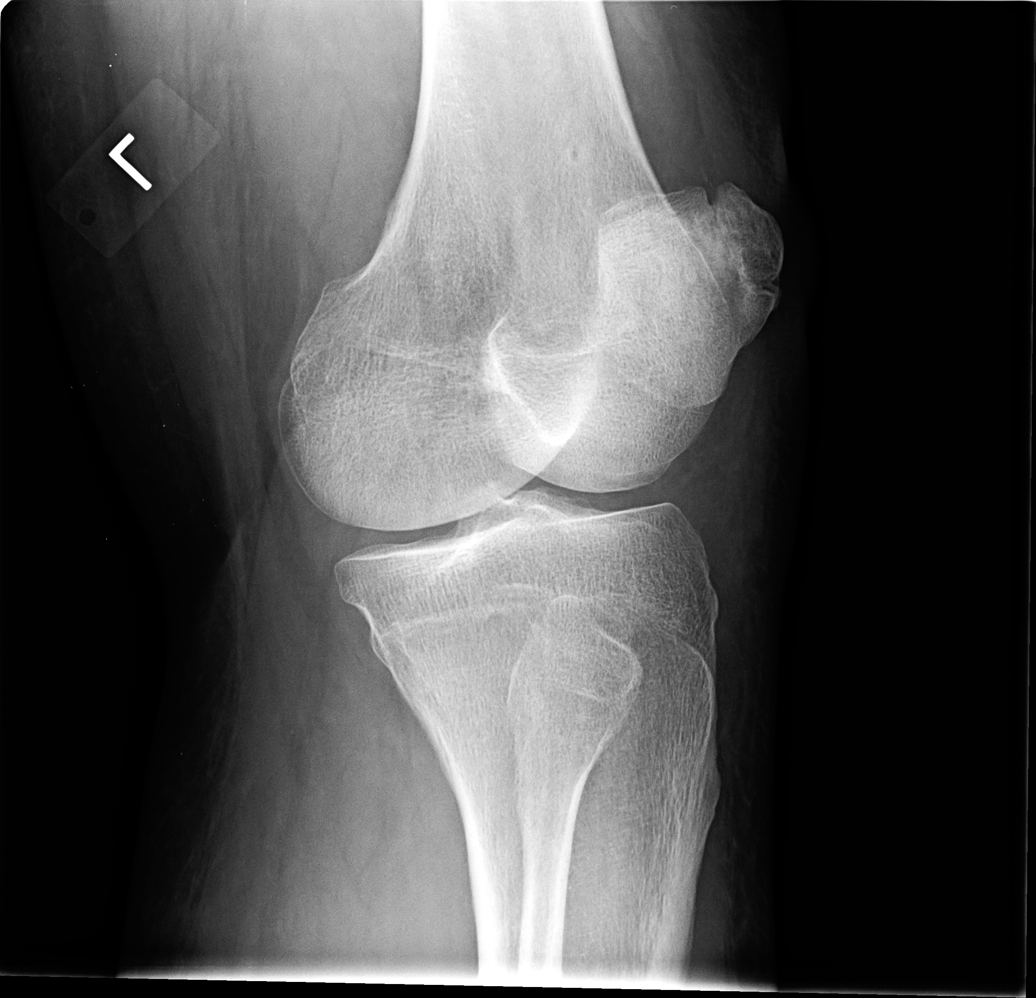

[view not recorded (4 of 4)]
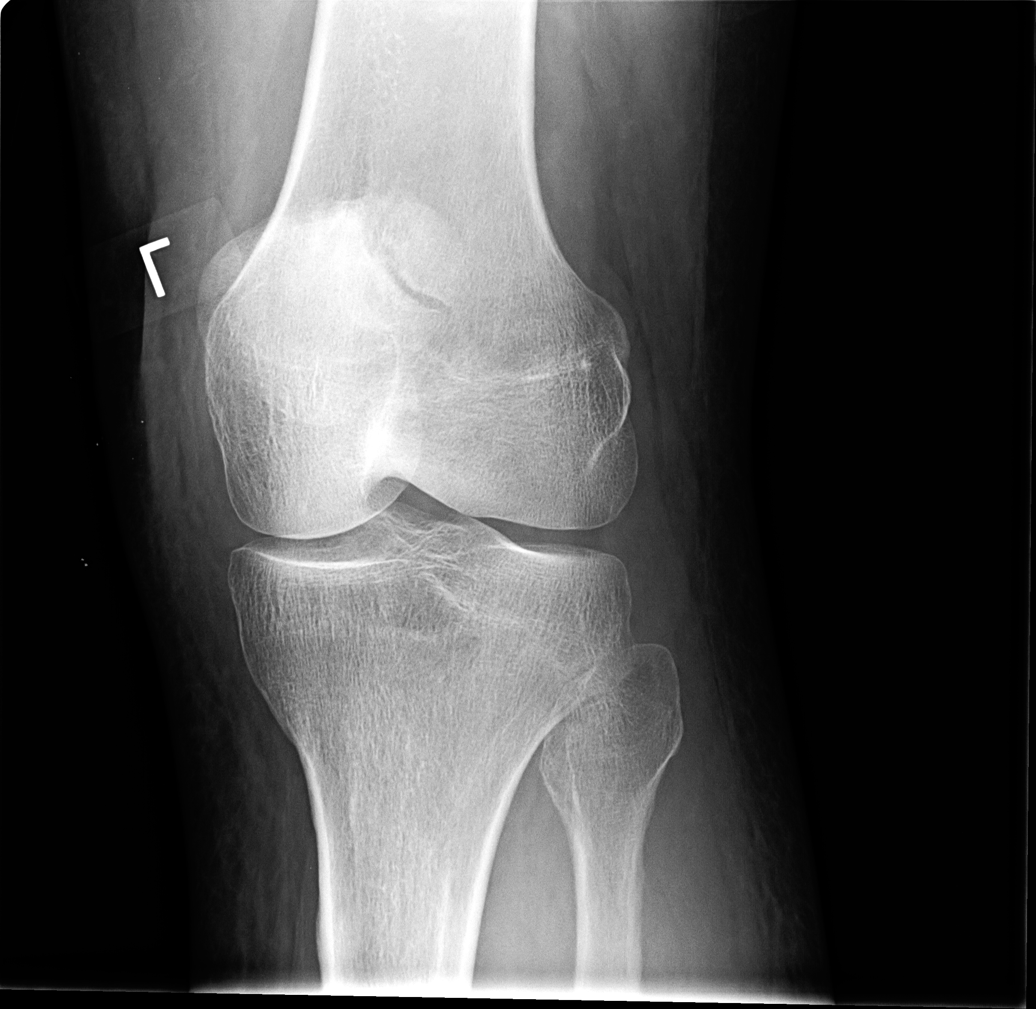

[4 of 4 positions shown; findings below may reference images not displayed]

FINDINGS: Standing AP knees: No fracture or dislocation. Joint spaces appear
symmetric and intact. There is a bipartite patella on the left, an
anatomic variant.

Left knee: Frontal, lateral, and bilateral oblique views were
obtained. There is a bipartite patella on the left, an anatomic
variant. There is no fracture, dislocation, or effusion. There is no
appreciable joint space narrowing. No erosive change.
IMPRESSION: Standing AP knees: No abnormality noted. Bipartite patella on the
left, an anatomic variant.

Left knee: No appreciable arthropathic change. No fracture or
dislocation. No left joint effusion. Bipartite patella on the left,
an anatomic variant.

## 2016-07-02 ENCOUNTER — Encounter: Payer: Self-pay | Admitting: Family Medicine

## 2016-07-02 ENCOUNTER — Ambulatory Visit (INDEPENDENT_AMBULATORY_CARE_PROVIDER_SITE_OTHER): Payer: BLUE CROSS/BLUE SHIELD | Admitting: Family Medicine

## 2016-07-02 VITALS — BP 97/51 | HR 68 | Temp 97.4°F | Ht 71.0 in | Wt 253.0 lb

## 2016-07-02 DIAGNOSIS — E559 Vitamin D deficiency, unspecified: Secondary | ICD-10-CM

## 2016-07-02 DIAGNOSIS — E1169 Type 2 diabetes mellitus with other specified complication: Secondary | ICD-10-CM

## 2016-07-02 DIAGNOSIS — N400 Enlarged prostate without lower urinary tract symptoms: Secondary | ICD-10-CM | POA: Diagnosis not present

## 2016-07-02 DIAGNOSIS — I10 Essential (primary) hypertension: Secondary | ICD-10-CM

## 2016-07-02 DIAGNOSIS — E7800 Pure hypercholesterolemia, unspecified: Secondary | ICD-10-CM | POA: Diagnosis not present

## 2016-07-02 DIAGNOSIS — N5201 Erectile dysfunction due to arterial insufficiency: Secondary | ICD-10-CM

## 2016-07-02 DIAGNOSIS — E785 Hyperlipidemia, unspecified: Secondary | ICD-10-CM

## 2016-07-02 DIAGNOSIS — E118 Type 2 diabetes mellitus with unspecified complications: Secondary | ICD-10-CM | POA: Diagnosis not present

## 2016-07-02 DIAGNOSIS — S76211A Strain of adductor muscle, fascia and tendon of right thigh, initial encounter: Secondary | ICD-10-CM

## 2016-07-02 DIAGNOSIS — Z23 Encounter for immunization: Secondary | ICD-10-CM

## 2016-07-02 NOTE — Progress Notes (Signed)
Subjective:    Patient ID: Joseph Kirby, male    DOB: 08-20-1956, 60 y.o.   MRN: [id number]  HPI Pt here for follow up and management of chronic medical problems which includes diabetes, hyperlipidemia and hypertension. He is taking medications regularly.This patient has insulin-dependent diabetes. He is due for lab work but has had this done at work and will bring [place] a copy of this. His last hemoglobin A1c was 7.3%. His also due to his flu shot and we will give him that today. He is complaining today of some left ankle pain and right groin pain. He does not need any refills. The patient continues to be followed by an endocrinology group in [place]. He is also due for an eye exam. The patient goes to the [place] clinic for his diabetic control. He had an A1c at the [facility] clinic in [place] about 1 month ago and it was 7.3%. He says also that he was getting off the toilet and and standing up felt to stretch or pain in the right groin area. This happened recently but has continued to bother him especially when he is carrying something. He had lab work done last week by [facility] and he will bring [place] a copy of that for review he does plan to get his flu shot today. He denies any chest pain or any more shortness of breath than usual. He is not having any trouble swallowing his food including heartburn indigestion nausea vomiting diarrhea or blood in the stool. He is passing his water without problems. He was reminded that he needs to get an eye exam and he will make sure that he has them send [place] a copy of the report. The patient did see the cardiologist in September and he felt that the patient needed no further testing at this time as he had had a previous cardiac cath with nonobstructive disease. He did have a stress test in December 2016. The EKG and stress test were negative for ischemia. And there were no arrhythmias. As of note, the patient's last creatinine in June was good. We will wait for his  health stat     Patient Active Problem List   Diagnosis Date Noted  . Diabetes, insulin-dependent 06/20/2012  . CAD (coronary artery disease)   . Hypertension   . Hypothyroidism   . Ejection fraction   . DKA (diabetic ketoacidoses), history of 03/17/2012  . BPH (benign prostatic hyperplasia)   . Colon polyp   . DYSLIPIDEMIA 03/27/2010   Outpatient Encounter Prescriptions as of 07/02/2016  Medication Sig  . aspirin 81 MG tablet Take 81 mg by mouth daily.    Marland Kitchen atorvastatin (LIPITOR) 80 MG tablet Take 1 tablet (80 mg total) by mouth daily.  . B-D INS SYR ULTRAFINE 1CC/30G 30G X 1/2" 1 ML MISC Use as directed three times a day  . Cholecalciferol (VITAMIN D) 2000 UNITS CAPS Take 2 capsules by mouth daily.  . diclofenac (VOLTAREN) 75 MG EC tablet Take 1 tablet (75 mg total) by mouth 2 (two) times daily. For muscle and  Joint pain  . glucose blood (ONE TOUCH ULTRA TEST) test strip Test bid. DX E11.9  . HUMULIN R 100 UNIT/ML injection Inject 0.1 mLs (10 Units  total) into the skin 2  (two) times daily before a  meal.  . insulin NPH Human (HUMULIN N) 100 UNIT/ML injection Inject 50u in am, 40u in pm as directed  . Lancets (ONETOUCH ULTRASOFT) lancets Test bid DX E11.9  .  levothyroxine (SYNTHROID) 175 MCG tablet Take 1 tablet daily.None on sundays. (Patient taking differently: Take 175 mcg by mouth daily before breakfast. Take 1 tablet daily.None on sundays.)  . niacin (NIASPAN) 500 MG CR tablet Take 3 tablets by mouth at  bedtime  . nystatin-triamcinolone (MYCOLOG II) cream Apply 1 application topically 2 (two) times daily.  Marland Kitchen. omega-3 acid ethyl esters (LOVAZA) 1 g capsule Take 2 capsules (2 g total) by mouth 2 (two) times daily.  . ramipril (ALTACE) 5 MG capsule Take 1 capsule by mouth  daily  . ranitidine (ZANTAC) 150 MG tablet Take 1 tablet by mouth two  times daily  . sildenafil (REVATIO) 20 MG tablet Take 2-5 tabs PRN prior to Sexual activity.   No facility-administered encounter  medications on file as of 07/02/2016.      Review of Systems  Constitutional: Negative.   HENT: Negative.   Eyes: Negative.   Respiratory: Negative.   Cardiovascular: Negative.   Gastrointestinal: Negative.   Endocrine: Negative.   Genitourinary: Negative.   Musculoskeletal: Positive for arthralgias (left ankle) and myalgias (right side groin pain).  Skin: Negative.   Allergic/Immunologic: Negative.   Neurological: Negative.   Hematological: Negative.   Psychiatric/Behavioral: Negative.    It is important to note that a brother 3 years older than the patient has been diagnosed with some kind of abdominal cancer. The patient is not sure if it was large intestine small intestine or some other kind of cancer he is to find this out and let us know when he brings his blood work back from his place of employment    Objective:   Physical Exam  Constitutional: He is oriented to person, place, and time. He appears well-developed and well-nourished. No distress.  HENT:  Head: Normocephalic and atraumatic.  Right Ear: External ear normal.  Mouth/Throat: Oropharynx is clear and moist. No oropharyngeal exudate.  The left TM was slightly pink compared to the right. There was more nasal congestion on the left compared to the right.  Eyes: Conjunctivae and EOM are normal. Pupils are equal, round, and reactive to light. Right eye exhibits no discharge. Left eye exhibits no discharge. No scleral icterus.  Neck: Normal range of motion. Neck supple. No thyromegaly present.  No bruits thyromegaly or anterior cervical adenopathy  Cardiovascular: Normal rate, regular rhythm, normal heart sounds and intact distal pulses.   No murmur heard. The heart had a regular rate and rhythm at 72/m.  Pulmonary/Chest: Effort normal and breath sounds normal. No respiratory distress. He has no wheezes. He has no rales. He exhibits no tenderness.  Lungs were clear anteriorly and posteriorly Both axillary regions were  clear of adenopathy  Abdominal: Soft. Bowel sounds are normal. He exhibits no mass. There is no tenderness. There is no rebound and no guarding.  The abdomen was obese without masses or organ enlargement bruits. There were no inguinal hernias palpable and no inguinal nodes. The patient did note with laying down and that he felt some discomfort in the inguinal area on the right side. Upon standing there were no bulges indicating any hernias present even in this position.  Genitourinary:  Genitourinary Comments: No inguinal hernias palpable  Musculoskeletal: Normal range of motion. He exhibits no edema.  Slight tenderness in the right groin area over the lateral symphysis pubis  Lymphadenopathy:    He has no cervical adenopathy.  Neurological: He is alert and oriented to person, place, and time. He has normal reflexes. No cranial nerve deficit.  Skin: Skin is warm and dry. No rash noted.  Psychiatric: He has a normal mood and affect. His behavior is normal. Judgment and thought content normal.  Nursing note and vitals reviewed.  BP (!) 97/51 (BP Location: Left Arm)   Pulse 68   Temp 97.4 F (36.3 C) (Oral)   Ht 5\' 11"  (1.803 m)   Wt 253 lb (114.8 kg)   BMI 35.29 kg/m         Assessment & Plan:  1. Essential hypertension -The blood pressure is good today and on the low side slightly. He will continue with current treatment and watch sodium intake  2. Pure hypercholesterolemia -Continue with current treatment pending results and review of lab work to be brought in  3. Vitamin D deficiency -Continue current treatment pending results of review of lab work to be brought in  4. Type 2 diabetes mellitus without complication with long-term use of insulin -According to the patient his last hemoglobin A1c was 7.3% and this was by the endocrinology group at the [facility] in [place]. This was about 4 weeks ago.  5. Benign prostatic hyperplasia, unspecified whether lower urinary  tract symptoms present -The patient is not having any symptoms with passing his water.  6. Type 2 diabetes mellitus with hyperlipidemia (HCC) -Continue with aggressive therapeutic lifestyle changes  7. Erectile dysfunction due to arterial insufficiency -Continue with sildenafil  8. Groin strain, right, initial encounter -Use warm wet compresses and avoid heavy lifting pushing pulling and straining and if the problem persists the patient should try some Aleve 1 twice daily after breakfast and supper for no more than 5-7 days to see if this will alleviate his discomfort  Patient Instructions  Continue current medications. Continue good therapeutic lifestyle changes which include good diet and exercise. Fall precautions discussed with patient. If an FOBT was given today- please return it to our front desk. If you are over 24 years old - you may need Prevnar 13 or the adult Pneumonia vaccine.  **Flu shots are available--- please call and schedule a FLU-CLINIC appointment**  After your visit with [place] today you will receive a survey in the mail or online from [employer] regarding your care with [place]. Please take a moment to fill this out. Your feedback is very important to [place] as you can help [place] better understand your patient needs as well as improve your experience and satisfaction. WE CARE ABOUT YOU!!!   Continue to monitor blood sugars closely and keep feet checked regularly. Continue to follow-up with endocrinology Do not forget to get your eye exam  Use warm wet compresses to the right Groin area. Try to avoid any heavy lifting pushing pulling or straining if possible If the discomfort continues for a short period of time because her creatinine was good you could take Aleve 1 twice daily after breakfast and supper but for no more than 1 week time period. The flu shot that you received today may make your arm sore  Nyra Capes MD

## 2016-07-02 NOTE — Patient Instructions (Addendum)
Continue current medications. Continue good therapeutic lifestyle changes which include good diet and exercise. Fall precautions discussed with patient. If an FOBT was given today- please return it to our front desk. If you are over 59 years old - you may need Prevnar 13 or the adult Pneumonia vaccine.  **Flu shots are available--- please call and schedule a FLU-CLINIC appointment**  After your visit with us today you will receive a survey in the mail or online from [employer] regarding your care with us. Please take a moment to fill this out. Your feedback is very important to us as you can help us better understand your patient needs as well as improve your experience and satisfaction. WE CARE ABOUT YOU!!!   Continue to monitor blood sugars closely and keep feet checked regularly. Continue to follow-up with endocrinology Do not forget to get your eye exam  Use warm wet compresses to the right Groin area. Try to avoid any heavy lifting pushing pulling or straining if possible If the discomfort continues for a short period of time because her creatinine was good you could take Aleve 1 twice daily after breakfast and supper but for no more than 1 week time period. The flu shot that you received today may make your arm sore

## 2016-07-04 DIAGNOSIS — Z6835 Body mass index (BMI) 35.0-35.9, adult: Secondary | ICD-10-CM | POA: Diagnosis not present

## 2016-07-04 DIAGNOSIS — E669 Obesity, unspecified: Secondary | ICD-10-CM | POA: Diagnosis not present

## 2016-07-16 ENCOUNTER — Other Ambulatory Visit: Payer: Self-pay | Admitting: Family Medicine

## 2016-07-20 DIAGNOSIS — G4733 Obstructive sleep apnea (adult) (pediatric): Secondary | ICD-10-CM | POA: Diagnosis not present

## 2016-08-21 DIAGNOSIS — G4733 Obstructive sleep apnea (adult) (pediatric): Secondary | ICD-10-CM | POA: Diagnosis not present

## 2016-08-22 DIAGNOSIS — Z6835 Body mass index (BMI) 35.0-35.9, adult: Secondary | ICD-10-CM | POA: Diagnosis not present

## 2016-08-22 DIAGNOSIS — E669 Obesity, unspecified: Secondary | ICD-10-CM | POA: Diagnosis not present

## 2016-09-17 DIAGNOSIS — E669 Obesity, unspecified: Secondary | ICD-10-CM | POA: Diagnosis not present

## 2016-09-17 DIAGNOSIS — E119 Type 2 diabetes mellitus without complications: Secondary | ICD-10-CM | POA: Diagnosis not present

## 2016-09-17 DIAGNOSIS — E559 Vitamin D deficiency, unspecified: Secondary | ICD-10-CM | POA: Diagnosis not present

## 2016-09-17 DIAGNOSIS — Z6835 Body mass index (BMI) 35.0-35.9, adult: Secondary | ICD-10-CM | POA: Diagnosis not present

## 2016-10-02 ENCOUNTER — Other Ambulatory Visit: Payer: Self-pay | Admitting: Family Medicine

## 2016-10-04 LAB — HM DIABETES EYE EXAM

## 2016-10-15 DIAGNOSIS — Z6835 Body mass index (BMI) 35.0-35.9, adult: Secondary | ICD-10-CM | POA: Diagnosis not present

## 2016-10-15 DIAGNOSIS — E669 Obesity, unspecified: Secondary | ICD-10-CM | POA: Diagnosis not present

## 2016-10-19 DIAGNOSIS — G4733 Obstructive sleep apnea (adult) (pediatric): Secondary | ICD-10-CM | POA: Diagnosis not present

## 2016-10-22 DIAGNOSIS — E118 Type 2 diabetes mellitus with unspecified complications: Secondary | ICD-10-CM | POA: Diagnosis not present

## 2016-10-23 ENCOUNTER — Other Ambulatory Visit: Payer: Self-pay | Admitting: Family Medicine

## 2016-11-21 ENCOUNTER — Encounter: Payer: Self-pay | Admitting: Family Medicine

## 2016-11-21 ENCOUNTER — Ambulatory Visit (INDEPENDENT_AMBULATORY_CARE_PROVIDER_SITE_OTHER): Payer: BLUE CROSS/BLUE SHIELD | Admitting: Family Medicine

## 2016-11-21 ENCOUNTER — Ambulatory Visit (INDEPENDENT_AMBULATORY_CARE_PROVIDER_SITE_OTHER): Payer: BLUE CROSS/BLUE SHIELD

## 2016-11-21 VITALS — BP 131/70 | HR 71 | Temp 97.8°F | Ht 71.0 in | Wt 255.0 lb

## 2016-11-21 DIAGNOSIS — E118 Type 2 diabetes mellitus with unspecified complications: Secondary | ICD-10-CM | POA: Diagnosis not present

## 2016-11-21 DIAGNOSIS — I10 Essential (primary) hypertension: Secondary | ICD-10-CM

## 2016-11-21 DIAGNOSIS — N400 Enlarged prostate without lower urinary tract symptoms: Secondary | ICD-10-CM | POA: Diagnosis not present

## 2016-11-21 DIAGNOSIS — R5383 Other fatigue: Secondary | ICD-10-CM

## 2016-11-21 DIAGNOSIS — E7800 Pure hypercholesterolemia, unspecified: Secondary | ICD-10-CM

## 2016-11-21 DIAGNOSIS — E559 Vitamin D deficiency, unspecified: Secondary | ICD-10-CM | POA: Diagnosis not present

## 2016-11-21 DIAGNOSIS — M79674 Pain in right toe(s): Secondary | ICD-10-CM | POA: Diagnosis not present

## 2016-11-21 NOTE — Patient Instructions (Addendum)
  Continue current medications. Continue good therapeutic lifestyle changes which include good diet and exercise. Fall precautions discussed with patient. If an FOBT was given today- please return it to our front desk. If you are over 60 years old - you may need Prevnar 13 or the adult Pneumonia vaccine.  **Flu shots are available--- please call and schedule a FLU-CLINIC appointment**  After your visit with us today you will receive a survey in the mail or online from [employer] regarding your care with us. Please take a moment to fill this out. Your feedback is very important to us as you can help us better understand your patient needs as well as improve your experience and satisfaction. WE CARE ABOUT YOU!!!   Continue to follow-up with endocrinology We will call with results of lab work being done today sent as those results become available Continue with CPAP Check blood pressures at home and let us know if they keep running as low as they were in the office today. This could account for some of your fatigue with sitting down.--- The patient is to check with his brother regarding his stomach cancer and make sure there is nothing that we need to do for the patient or any of his other siblings other than routine follow-up with colonoscopy in FOBT's. Patient should call us back with this information and he understands to do this.+++++

## 2016-11-21 NOTE — Progress Notes (Addendum)
Subjective:    Patient ID: Joseph Kirby, male    DOB: Jul 15, 1956, 61 y.o.   MRN: [id number]  HPI Pt here for follow up and management of chronic medical problems which includes diabetes, hypothyroid, and hyperlipidemia. He is taking medication regularly.The patient is complaining with fatigue today. He also complains of right foot and toe pain. He brings in lab work from mid October which includes a blood sugar of 103 and cholesterol values that have a triglyceride of 53 and LDL C cholesterol of 73. In October he had a PSA which was 0.5. His last hemoglobin A1c was 7.3% with his endocrinologist. The patient describes his fatigue that if he sits down he dozes off. He uses his CPAP nightly. He just saw his endocrinologist in the past month and sees him every 3-4 months and his A1c was as mentioned 7.3%. The patient denies any chest pain or shortness of breath. He denies any trouble with swallowing heartburn indigestion nausea vomiting diarrhea or blood in the stool. His last colonoscopy was in May 2009. He says he's passing his water without problems. Had an eye exam and says he has an early cataract. He saw the cardiologist in the past year, actually in September 2017 and was told to come back if needed.It is also important to note that the patient has 2 brothers and one sister. One of his brothers has stomach cancer. The patient is very clear that is not esophageal small intestine or: That it is stomach cancer.     Patient Active Problem List   Diagnosis Date Noted  . Diabetes, insulin-dependent 06/20/2012  . CAD (coronary artery disease)   . Hypertension   . Hypothyroidism   . Ejection fraction   . DKA (diabetic ketoacidoses), history of 03/17/2012  . BPH (benign prostatic hyperplasia)   . Colon polyp   . DYSLIPIDEMIA 03/27/2010   Outpatient Encounter Prescriptions as of 11/21/2016  Medication Sig  . ALTACE 5 MG capsule TAKE 1 CAPSULE BY MOUTH  DAILY  . aspirin 81 MG tablet Take 81 mg by  mouth daily.    Marland Kitchen atorvastatin (LIPITOR) 80 MG tablet TAKE 1 TABLET BY MOUTH  DAILY  . B-D INS SYR ULTRAFINE 1CC/30G 30G X 1/2" 1 ML MISC Use as directed three times a day  . Cholecalciferol (VITAMIN D) 2000 UNITS CAPS Take 2 capsules by mouth daily.  Marland Kitchen glucose blood (ONE TOUCH ULTRA TEST) test strip Test bid. DX E11.9  . HUMULIN N 100 UNIT/ML injection INJECT SUBCUTANEOUSLY 50  UNITS IN THE MORNING AND 40 UNITS IN THE EVENING AS  DIRECTED (Patient taking differently: INJECT SUBCUTANEOUSLY 56  UNITS IN THE MORNING AND 46 UNITS IN THE EVENING AS  DIRECTED)  . HUMULIN R 100 UNIT/ML injection INJECT 0.1 MLS (10 UNITS  TOTAL) INTO THE SKIN 2  (TWO) TIMES DAILY BEFORE A  MEAL. (Patient taking differently: INJECT 0.1 MLS (10-20 UNITS  TOTAL) INTO THE SKIN 2  (TWO) TIMES DAILY BEFORE A  MEAL.)  . levothyroxine (SYNTHROID, LEVOTHROID) 175 MCG tablet TAKE 1 TABLET DAILY.NONE ON SUNDAYS.  Marland Kitchen niacin (NIASPAN) 500 MG CR tablet TAKE 3 TABLETS BY MOUTH AT  BEDTIME  . nystatin-triamcinolone (MYCOLOG II) cream Apply 1 application topically 2 (two) times daily.  Marland Kitchen omega-3 acid ethyl esters (LOVAZA) 1 g capsule Take 2 capsules (2 g total) by mouth 2 (two) times daily.  . ranitidine (ZANTAC) 150 MG tablet Take 1 tablet by mouth two  times daily  . sildenafil (REVATIO) 20  MG tablet Take 2-5 tabs PRN prior to Sexual activity.  . [DISCONTINUED] diclofenac (VOLTAREN) 75 MG EC tablet Take 1 tablet (75 mg total) by mouth 2 (two) times daily. For muscle and  Joint pain  . [DISCONTINUED] Lancets (ONETOUCH ULTRASOFT) lancets Test bid DX E11.9   No facility-administered encounter medications on file as of 11/21/2016.      Review of Systems  Constitutional: Positive for fatigue.  HENT: Negative.   Eyes: Negative.   Respiratory: Negative.   Cardiovascular: Negative.   Gastrointestinal: Negative.   Endocrine: Negative.   Genitourinary: Negative.   Musculoskeletal: Positive for arthralgias (right foot toe pain).  Skin:  Negative.   Allergic/Immunologic: Negative.   Neurological: Negative.   Hematological: Negative.   Psychiatric/Behavioral: Negative.        Objective:   Physical Exam  Constitutional: He is oriented to person, place, and time. He appears well-developed and well-nourished.  Patient is pleasant and alert and has a delayed back demeanor. He says he is always been this way.  HENT:  Head: Normocephalic and atraumatic.  Right Ear: External ear normal.  Left Ear: External ear normal.  Mouth/Throat: Oropharynx is clear and moist. No oropharyngeal exudate.  Some nasal congestion and turbinate swelling bilaterally.  Eyes: Conjunctivae and EOM are normal. Pupils are equal, round, and reactive to light. Right eye exhibits no discharge. Left eye exhibits no discharge. No scleral icterus.  The patient just had his eyes examined and everything was good. This was done at my eye doctor.  Neck: Normal range of motion. Neck supple. No thyromegaly present.  No bruits thyromegaly or anterior cervical adenopathy  Cardiovascular: Normal rate, regular rhythm, normal heart sounds and intact distal pulses.   No murmur heard. The heart has a regular rate and rhythm today at 72/m  Pulmonary/Chest: Effort normal and breath sounds normal. No respiratory distress. He has no wheezes. He has no rales. He exhibits no tenderness.  There is no axillary adenopathy. Clear anteriorly and posteriorly  Abdominal: Soft. Bowel sounds are normal. He exhibits no mass. There is no tenderness. There is no rebound and no guarding.  The abdomen is obese without liver or spleen enlargement epigastric tenderness or inguinal adenopathy. There are no bruits.  Musculoskeletal: Normal range of motion. He exhibits no edema.  The third toe of the right foot was swollen but had good mobility without tenderness.  Lymphadenopathy:    He has no cervical adenopathy.  Neurological: He is alert and oriented to person, place, and time. He has  normal reflexes. No cranial nerve deficit.  Skin: Skin is warm and dry. No rash noted.  Psychiatric: He has a normal mood and affect. His behavior is normal. Judgment and thought content normal.  Nursing note and vitals reviewed.  BP (!) 97/53 (BP Location: Left Arm)   Pulse 63   Temp 97.8 F (36.6 C) (Oral)   Ht 5' 11"  (1.803 m)   Wt 255 lb (115.7 kg)   BMI 35.57 kg/m   X-ray of foot to include third toe of right foot with results pending      Assessment & Plan:  1. Essential hypertension -The blood pressure is actually on the low end of the range today. We will recheck this before he leaves office and ask him to get some readings from home and let us know if he gets running this low as this could account for some of his fatigue issues. - BMP8+EGFR - Hepatic function panel  2. Vitamin D deficiency -  Continue current treatment pending results of lab work  3. Pure hypercholesterolemia -Continue current treatment as outside cholesterol numbers were good.  4. Type 2 diabetes mellitus without complication with long-term use of insulin -Continue to follow-up with endocrinology. The patient says his last hemoglobin A1c 1 month ago was 7.3% and this was with Dr. Tawnya Crook. - BMP8+EGFR  5. Benign prostatic hyperplasia, unspecified whether lower urinary tract symptoms present -No complaints with voiding today.  6. Other fatigue -Monitor blood pressures at home and bring readings by for review - Thyroid Panel With TSH - Vitamin B12 - CBC with Differential/Platelet  7. Pain in right toe(s) -The toe pain is improved and the patient in fact has no complaints with it today other than having the toe being more swollen. - DG Foot Complete Right; Future  8. Pain of toe of right foot -As above.  Patient Instructions   Continue current medications. Continue good therapeutic lifestyle changes which include good diet and exercise. Fall precautions discussed with patient. If an FOBT was  given today- please return it to our front desk. If you are over 60 years old - you may need Prevnar 54 or the adult Pneumonia vaccine.  **Flu shots are available--- please call and schedule a FLU-CLINIC appointment**  After your visit with [place] today you will receive a survey in the mail or online from [employer] regarding your care with [place]. Please take a moment to fill this out. Your feedback is very important to [place] as you can help [place] better understand your patient needs as well as improve your experience and satisfaction. WE CARE ABOUT YOU!!!   Continue to follow-up with endocrinology We will call with results of lab work being done today sent as those results become available Continue with CPAP Check blood pressures at home and let us know if they keep running as low as they were in the office today. This could account for some of your fatigue with sitting down.---  Arrie Senate MD

## 2016-11-22 LAB — CBC WITH DIFFERENTIAL/PLATELET
BASOS ABS: 0 10*3/uL (ref 0.0–0.2)
BASOS: 0 %
EOS (ABSOLUTE): 0.2 10*3/uL (ref 0.0–0.4)
Eos: 3 %
Hematocrit: 41.3 % (ref 37.5–51.0)
Hemoglobin: 14.1 g/dL (ref 13.0–17.7)
IMMATURE GRANS (ABS): 0 10*3/uL (ref 0.0–0.1)
IMMATURE GRANULOCYTES: 0 %
Lymphocytes Absolute: 1.7 10*3/uL (ref 0.7–3.1)
Lymphs: 26 %
MCH: 27.9 pg (ref 26.6–33.0)
MCHC: 34.1 g/dL (ref 31.5–35.7)
MCV: 82 fL (ref 79–97)
MONOS ABS: 0.6 10*3/uL (ref 0.1–0.9)
Monocytes: 9 %
NEUTROS PCT: 62 %
Neutrophils Absolute: 4.1 10*3/uL (ref 1.4–7.0)
Platelets: 256 10*3/uL (ref 150–379)
RBC: 5.06 x10E6/uL (ref 4.14–5.80)
RDW: 13.9 % (ref 12.3–15.4)
WBC: 6.6 10*3/uL (ref 3.4–10.8)

## 2016-11-22 LAB — HEPATIC FUNCTION PANEL
ALBUMIN: 4 g/dL (ref 3.5–5.5)
ALT: 23 IU/L (ref 0–44)
AST: 27 IU/L (ref 0–40)
Alkaline Phosphatase: 112 IU/L (ref 39–117)
BILIRUBIN TOTAL: 0.3 mg/dL (ref 0.0–1.2)
BILIRUBIN, DIRECT: 0.12 mg/dL (ref 0.00–0.40)
Total Protein: 6.3 g/dL (ref 6.0–8.5)

## 2016-11-22 LAB — BMP8+EGFR
BUN/Creatinine Ratio: 14 (ref 9–20)
BUN: 13 mg/dL (ref 6–24)
CALCIUM: 9.5 mg/dL (ref 8.7–10.2)
CHLORIDE: 102 mmol/L (ref 96–106)
CO2: 28 mmol/L (ref 18–29)
Creatinine, Ser: 0.9 mg/dL (ref 0.76–1.27)
GFR, EST AFRICAN AMERICAN: 108 mL/min/{1.73_m2} (ref >59)
GFR, EST NON AFRICAN AMERICAN: 93 mL/min/{1.73_m2} (ref >59)
Glucose: 69 mg/dL (ref 65–99)
Potassium: 3.8 mmol/L (ref 3.5–5.2)
Sodium: 141 mmol/L (ref 134–144)

## 2016-11-22 LAB — THYROID PANEL WITH TSH
FREE THYROXINE INDEX: 2 (ref 1.2–4.9)
T3 UPTAKE RATIO: 32 % (ref 24–39)
T4, Total: 6.1 ug/dL (ref 4.5–12.0)
TSH: 1.48 u[IU]/mL (ref 0.450–4.500)

## 2016-11-22 LAB — VITAMIN B12: VITAMIN B 12: 309 pg/mL (ref 232–1245)

## 2016-11-26 DIAGNOSIS — Z713 Dietary counseling and surveillance: Secondary | ICD-10-CM | POA: Diagnosis not present

## 2016-11-26 DIAGNOSIS — Z6835 Body mass index (BMI) 35.0-35.9, adult: Secondary | ICD-10-CM | POA: Diagnosis not present

## 2016-11-26 DIAGNOSIS — E669 Obesity, unspecified: Secondary | ICD-10-CM | POA: Diagnosis not present

## 2016-12-16 ENCOUNTER — Other Ambulatory Visit: Payer: Self-pay | Admitting: Family Medicine

## 2017-01-07 DIAGNOSIS — Z008 Encounter for other general examination: Secondary | ICD-10-CM | POA: Diagnosis not present

## 2017-01-07 DIAGNOSIS — E039 Hypothyroidism, unspecified: Secondary | ICD-10-CM | POA: Diagnosis not present

## 2017-01-07 DIAGNOSIS — E119 Type 2 diabetes mellitus without complications: Secondary | ICD-10-CM | POA: Diagnosis not present

## 2017-01-07 DIAGNOSIS — E559 Vitamin D deficiency, unspecified: Secondary | ICD-10-CM | POA: Diagnosis not present

## 2017-01-07 DIAGNOSIS — Z719 Counseling, unspecified: Secondary | ICD-10-CM | POA: Diagnosis not present

## 2017-01-07 DIAGNOSIS — E669 Obesity, unspecified: Secondary | ICD-10-CM | POA: Diagnosis not present

## 2017-01-07 DIAGNOSIS — Z6835 Body mass index (BMI) 35.0-35.9, adult: Secondary | ICD-10-CM | POA: Diagnosis not present

## 2017-01-31 DIAGNOSIS — G4733 Obstructive sleep apnea (adult) (pediatric): Secondary | ICD-10-CM | POA: Diagnosis not present

## 2017-02-22 ENCOUNTER — Other Ambulatory Visit: Payer: Self-pay | Admitting: Family Medicine

## 2017-02-26 ENCOUNTER — Other Ambulatory Visit: Payer: Self-pay | Admitting: *Deleted

## 2017-02-26 MED ORDER — "INSULIN SYRINGE-NEEDLE U-100 30G X 1/2"" 1 ML MISC": 1 refills | Status: DC

## 2017-03-15 DIAGNOSIS — G4733 Obstructive sleep apnea (adult) (pediatric): Secondary | ICD-10-CM | POA: Diagnosis not present

## 2017-03-19 ENCOUNTER — Encounter: Payer: Self-pay | Admitting: *Deleted

## 2017-03-19 ENCOUNTER — Telehealth: Payer: Self-pay | Admitting: Family Medicine

## 2017-03-19 NOTE — Telephone Encounter (Signed)
Amy is this one of the industrial Immunizations? I don't see a nurse visit or anything but I can see you gave it and wasn't sure if he needed to schedule an appt with you of how that works.

## 2017-03-20 ENCOUNTER — Ambulatory Visit (INDEPENDENT_AMBULATORY_CARE_PROVIDER_SITE_OTHER): Payer: BLUE CROSS/BLUE SHIELD | Admitting: *Deleted

## 2017-03-20 DIAGNOSIS — Z23 Encounter for immunization: Secondary | ICD-10-CM

## 2017-03-20 NOTE — Progress Notes (Signed)
Pt given 2nd Hep B immunization for [employer] Pt tolerated well

## 2017-03-21 NOTE — Telephone Encounter (Signed)
Patient came in yesterday to have 2nd Hep B vaccine.  Called and advised him that he will need the 3rd Hep B vaccine after 06/14/17.

## 2017-03-24 ENCOUNTER — Other Ambulatory Visit: Payer: Self-pay | Admitting: Family Medicine

## 2017-04-01 DIAGNOSIS — E119 Type 2 diabetes mellitus without complications: Secondary | ICD-10-CM | POA: Diagnosis not present

## 2017-04-01 DIAGNOSIS — E039 Hypothyroidism, unspecified: Secondary | ICD-10-CM | POA: Diagnosis not present

## 2017-04-01 DIAGNOSIS — E559 Vitamin D deficiency, unspecified: Secondary | ICD-10-CM | POA: Diagnosis not present

## 2017-04-01 DIAGNOSIS — Z6836 Body mass index (BMI) 36.0-36.9, adult: Secondary | ICD-10-CM | POA: Diagnosis not present

## 2017-04-01 DIAGNOSIS — Z008 Encounter for other general examination: Secondary | ICD-10-CM | POA: Diagnosis not present

## 2017-04-05 ENCOUNTER — Ambulatory Visit: Payer: BLUE CROSS/BLUE SHIELD | Admitting: Family Medicine

## 2017-04-24 ENCOUNTER — Ambulatory Visit (INDEPENDENT_AMBULATORY_CARE_PROVIDER_SITE_OTHER): Payer: BLUE CROSS/BLUE SHIELD | Admitting: Family Medicine

## 2017-04-24 ENCOUNTER — Other Ambulatory Visit: Payer: Self-pay | Admitting: Family Medicine

## 2017-04-24 ENCOUNTER — Encounter: Payer: Self-pay | Admitting: Family Medicine

## 2017-04-24 VITALS — BP 100/51 | HR 69 | Temp 98.1°F | Ht 71.0 in | Wt 257.0 lb

## 2017-04-24 DIAGNOSIS — I10 Essential (primary) hypertension: Secondary | ICD-10-CM

## 2017-04-24 DIAGNOSIS — N400 Enlarged prostate without lower urinary tract symptoms: Secondary | ICD-10-CM | POA: Diagnosis not present

## 2017-04-24 DIAGNOSIS — E7800 Pure hypercholesterolemia, unspecified: Secondary | ICD-10-CM

## 2017-04-24 DIAGNOSIS — Z0000 Encounter for general adult medical examination without abnormal findings: Secondary | ICD-10-CM | POA: Diagnosis not present

## 2017-04-24 DIAGNOSIS — E109 Type 1 diabetes mellitus without complications: Secondary | ICD-10-CM

## 2017-04-24 DIAGNOSIS — E559 Vitamin D deficiency, unspecified: Secondary | ICD-10-CM | POA: Diagnosis not present

## 2017-04-24 DIAGNOSIS — E118 Type 2 diabetes mellitus with unspecified complications: Secondary | ICD-10-CM | POA: Diagnosis not present

## 2017-04-24 LAB — BAYER DCA HB A1C WAIVED: HB A1C: 7.2 % — AB (ref <7.0)

## 2017-04-24 MED ORDER — FREESTYLE LIBRE READER DEVI: 1.0000 | Freq: Every day | 0 refills | Status: AC

## 2017-04-24 MED ORDER — FREESTYLE LIBRE SENSOR SYSTEM MISC: 3 refills | Status: AC

## 2017-04-24 NOTE — Progress Notes (Signed)
Subjective:    Patient ID: Joseph Kirby, male    DOB: Feb 06, 1956, 61 y.o.   MRN: [id number]  HPI Patient is here today for annual wellness exam and follow up of chronic medical problems which includes hyperlipidemia, diabetes, and hypertension. He is taking medication regularly.Joseph Kirby comes to the visit today with no specific complaints and this is typical for him. He is due to return an FOBT get a chest x-ray and get lab work done. We'll also get a urinalysis. The patient's weight is up a couple of pounds since the last visit. This patient does have diabetes BPH coronary artery disease hypertension and hyperlipidemia. The patient is laid back. This is his typical personality. He is not seeing the endocrinologist anymore in [place] but has plans to see another one. If he cannot come up with a specific 1 he will call us back and we will arrange for him to see one at [facility]. The patient denies any chest pain or shortness of breath. He denies any trouble with his stomach including swallowing heartburn indigestion nausea vomiting diarrhea or blood in the stool. In questioning him about his family history he does say that his brother has "stomach cancer", but he is not sure where this stomach cancer is located and he is going to try to find this out. The patient's last colonoscopy was in May 2009 and this patient will need another colonoscopy in May of next year. He's passing his water without problems. He is up-to-date on his eye exams and had his last eye exam about 4 months ago. He says that his blood sugars are very bearable anywhere from as low as 51 up to as high as 190. Is important to note that the patient had an exercise tolerance test in December 2016. The patient thinks that he is supposed to see the cardiologist every 2 years of that would be this coming December.    Patient Active Problem List   Diagnosis Date Noted  . Diabetes, insulin-dependent  06/20/2012  . CAD (coronary artery disease)   . Hypertension   . Primary hypothyroidism   . Ejection fraction   . DKA (diabetic ketoacidoses), history of 03/17/2012  . BPH (benign prostatic hyperplasia)   . Colon polyp   . DYSLIPIDEMIA 03/27/2010   Outpatient Encounter Prescriptions as of 04/24/2017  Medication Sig  . ALTACE 5 MG capsule TAKE 1 CAPSULE BY MOUTH  DAILY  . aspirin 81 MG tablet Take 81 mg by mouth daily.    Marland Kitchen atorvastatin (LIPITOR) 80 MG tablet TAKE 1 TABLET BY MOUTH  DAILY  . Cholecalciferol (VITAMIN D) 2000 UNITS CAPS Take 2 capsules by mouth daily.  Marland Kitchen HUMULIN N 100 UNIT/ML injection INJECT SUBCUTANEOUSLY 50  UNITS IN THE MORNING AND 40 UNITS IN THE EVENING AS  DIRECTED (Patient taking differently: INJECT SUBCUTANEOUSLY 56  UNITS IN THE MORNING AND 46 UNITS IN THE EVENING AS  DIRECTED)  . HUMULIN R 100 UNIT/ML injection INJECT 0.1 MLS (10 UNITS  TOTAL) INTO THE SKIN 2  (TWO) TIMES DAILY BEFORE A  MEAL. (Patient taking differently: INJECT 20 units INTO THE SKIN 2  (TWO) TIMES DAILY BEFORE A  MEAL.)  . Insulin Syringe-Needle U-100 (B-D INS SYR ULTRAFINE 1CC/30G) 30G X 1/2" 1 ML MISC Use as directed three times a day  . levothyroxine (SYNTHROID, LEVOTHROID) 175 MCG tablet TAKE 1 TABLET DAILY.NONE ON SUNDAYS.  Marland Kitchen niacin (NIASPAN) 500 MG CR tablet TAKE 3 TABLETS BY MOUTH AT  BEDTIME  . nystatin-triamcinolone (MYCOLOG II) cream Apply 1 application topically 2 (two) times daily.  Marland Kitchen omega-3 acid ethyl esters (LOVAZA) 1 g capsule Take 2 capsules (2 g total) by mouth 2 (two) times daily.  . ONE TOUCH ULTRA TEST test strip TEST TWO TIMES DAILY  . ranitidine (ZANTAC) 150 MG tablet Take 1 tablet by mouth two  times daily  . sildenafil (REVATIO) 20 MG tablet Take 2-5 tabs PRN prior to Sexual activity.   No facility-administered encounter medications on file as of 04/24/2017.       Review of Systems  Constitutional: Negative.   HENT: Negative.   Eyes: Negative.   Respiratory:  Negative.   Cardiovascular: Negative.   Gastrointestinal: Negative.   Endocrine: Negative.   Genitourinary: Negative.   Musculoskeletal: Negative.   Skin: Negative.   Allergic/Immunologic: Negative.   Neurological: Negative.   Hematological: Negative.   Psychiatric/Behavioral: Negative.        Objective:   Physical Exam  Constitutional: He is oriented to person, place, and time. He appears well-developed and well-nourished. No distress.  The patient is calm and pleasant and alert  HENT:  Head: Normocephalic and atraumatic.  Right Ear: External ear normal.  Left Ear: External ear normal.  Nose: Nose normal.  Mouth/Throat: Oropharynx is clear and moist. No oropharyngeal exudate.  Eyes: Pupils are equal, round, and reactive to light. Conjunctivae and EOM are normal. Right eye exhibits no discharge. Left eye exhibits no discharge. No scleral icterus.  Neck: Normal range of motion. Neck supple. No thyromegaly present.  No bruits thyromegaly or anterior cervical adenopathy  Cardiovascular: Normal rate, regular rhythm, normal heart sounds and intact distal pulses.   No murmur heard. Good pedal pulses. The heart is regular at 72/m  Pulmonary/Chest: Effort normal and breath sounds normal. No respiratory distress. He has no wheezes. He has no rales. He exhibits no tenderness.  No axillary adenopathy  Abdominal: Soft. Bowel sounds are normal. He exhibits no mass. There is no tenderness. There is no rebound and no guarding.  There are no masses tenderness or organ enlargement The abdomen is obese. His BMI is 35.6.  Genitourinary: Rectum normal and penis normal.  Genitourinary Comments: The prostate is slightly enlarged but soft and smooth. There are no rectal masses. There are no inguinal hernias present. The external genitalia were within normal limits.  Musculoskeletal: Normal range of motion. He exhibits no edema.  Lymphadenopathy:    He has no cervical adenopathy.  Neurological: He is  alert and oriented to person, place, and time. He has normal reflexes. No cranial nerve deficit.  Skin: Skin is warm and dry. No rash noted.  Psychiatric: He has a normal mood and affect. His behavior is normal. Judgment and thought content normal.  Nursing note and vitals reviewed.   BP (!) 100/51 (BP Location: Left Arm)   Pulse 69   Temp 98.1 F (36.7 C) (Oral)   Ht 5' 11"  (1.803 m)   Wt 257 lb (116.6 kg)   BMI 35.84 kg/m        Assessment & Plan:  1. Essential hypertension -This patient's blood pressure always runs toward the low end of the range. He feels fine and we will not make any changes in his current medication. - BMP8+EGFR - CBC with Differential/Platelet - Hepatic function panel  2. Pure hypercholesterolemia -Continue with current treatment pending results of lab work. Continue with as aggressive therapeutic lifestyle changes as possible and healthy diet. - CBC with Differential/Platelet -  Lipid panel  3. Vitamin D deficiency -Continue with current treatment pending results of lab work - CBC with Differential/Platelet - VITAMIN D 25 Hydroxy (Vit-D Deficiency, Fractures)  4. Type 2 diabetes mellitus without complication with long-term use of insulin -Continue with current treatment -Patient has not been to see the endocrinologist recently and he was going to come back with another name for [place] and we can do another referral to a different one. - CBC with Differential/Platelet - Bayer DCA Hb A1c Waived  5. Benign prostatic hyperplasia, unspecified whether lower urinary tract symptoms present -The prostate is slightly enlarged these having no symptoms with this. - CBC with Differential/Platelet - Urinalysis, Complete - PSA, total and free  6. Annual physical exam -Patient has a family history of some kind of stomach cancer. This is in his brother. The patient will get back in touch with [place] after he finds out more directly where the cancer in the stomach is  located for his brother. This patient is not due to his 10 year colonoscopy until next spring. -The patient also needs to let us know when and who he will like to see as far as an endocrinologist is concerned. He seems to know someone else in [place] and he will let us know and he will take a copy of the blood work with him to that visit. - BMP8+EGFR - CBC with Differential/Platelet - Lipid panel - Hepatic function panel - Bayer DCA Hb A1c Waived - Urinalysis, Complete - VITAMIN D 25 Hydroxy (Vit-D Deficiency, Fractures) - PSA, total and free  7. 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 We're hopefully going to be able to get one of these for him so he can keep better tabs on his blood sugar readings.  Meds ordered this encounter  Medications  . Continuous Blood Gluc Receiver (FREESTYLE LIBRE READER) DEVI    Sig: 1 each by Does not apply route daily.    Dispense:  1 Device    Refill:  0  . Continuous Blood Gluc Sensor (FREESTYLE LIBRE SENSOR SYSTEM) MISC    Sig: APPLY 1 SENSOR SUBCUTANEOUSLY EVERY 10 DAYS    Dispense:  3 each    Refill:  3   Patient Instructions  Continue current medications. Continue good therapeutic lifestyle changes which include good diet and exercise. Fall precautions discussed with patient. If an FOBT was given today- please return it to our front desk. If you are over 3 years old - you may need Prevnar 15 or the adult Pneumonia vaccine.  **Flu shots are available--- please call and schedule a FLU-CLINIC appointment**  After your visit with [place] today you will receive a survey in the mail or online from [employer] regarding your care with [place]. Please take a moment to fill this out. Your feedback is very important to [place] as you can help [place] better understand your patient needs as well as improve your experience and satisfaction. WE CARE ABOUT YOU!!!   We will  try to order the Freestyle Libra diabetic monitor for you because of your blood sugars which are so variable. If the cost is too much continue checking blood sugars as you have been in the past Continue to eat healthy and stay is active physically as possible Check blood sugars regularly and frequently especially if they are running lower or if you feel bad. Please find out from your brother where  his stomach cancer is located and let us know Please give [place] the name of a endocrinologist that you can see in [place] and if you cannot come up with the name let us know and we will find one for you. Drink plenty of fluids and stay well hydrated    Arrie Senate MD

## 2017-04-24 NOTE — Patient Instructions (Addendum)
Continue current medications. Continue good therapeutic lifestyle changes which include good diet and exercise. Fall precautions discussed with patient. If an FOBT was given today- please return it to our front desk. If you are over 60 years old - you may need Prevnar 13 or the adult Pneumonia vaccine.  **Flu shots are available--- please call and schedule a FLU-CLINIC appointment**  After your visit with us today you will receive a survey in the mail or online from [employer] regarding your care with us. Please take a moment to fill this out. Your feedback is very important to us as you can help us better understand your patient needs as well as improve your experience and satisfaction. WE CARE ABOUT YOU!!!   We will try to order the Freestyle Libra diabetic monitor for you because of your blood sugars which are so variable. If the cost is too much continue checking blood sugars as you have been in the past Continue to eat healthy and stay is active physically as possible Check blood sugars regularly and frequently especially if they are running lower or if you feel bad. Please find out from your brother where his stomach cancer is located and let us know Please give us the name of a [place]ocr[place] and if you cannot come up with the name let us know and we will find one for you. Drink plenty of fluids and stay well hydrated

## 2017-04-25 LAB — CBC WITH DIFFERENTIAL/PLATELET
BASOS ABS: 0 10*3/uL (ref 0.0–0.2)
Basos: 0 %
EOS (ABSOLUTE): 0.3 10*3/uL (ref 0.0–0.4)
Eos: 6 %
HEMOGLOBIN: 14.1 g/dL (ref 13.0–17.7)
Hematocrit: 42.1 % (ref 37.5–51.0)
IMMATURE GRANULOCYTES: 0 %
Immature Grans (Abs): 0 10*3/uL (ref 0.0–0.1)
Lymphocytes Absolute: 1.6 10*3/uL (ref 0.7–3.1)
Lymphs: 26 %
MCH: 28.2 pg (ref 26.6–33.0)
MCHC: 33.5 g/dL (ref 31.5–35.7)
MCV: 84 fL (ref 79–97)
MONOCYTES: 10 %
Monocytes Absolute: 0.6 10*3/uL (ref 0.1–0.9)
NEUTROS ABS: 3.6 10*3/uL (ref 1.4–7.0)
Neutrophils: 58 %
PLATELETS: 248 10*3/uL (ref 150–379)
RBC: 5 x10E6/uL (ref 4.14–5.80)
RDW: 13.8 % (ref 12.3–15.4)
WBC: 6.2 10*3/uL (ref 3.4–10.8)

## 2017-04-25 LAB — HEPATIC FUNCTION PANEL
ALBUMIN: 4.1 g/dL (ref 3.6–4.8)
ALK PHOS: 110 IU/L (ref 39–117)
ALT: 24 IU/L (ref 0–44)
AST: 27 IU/L (ref 0–40)
Bilirubin Total: 0.3 mg/dL (ref 0.0–1.2)
Bilirubin, Direct: 0.13 mg/dL (ref 0.00–0.40)
Total Protein: 6.5 g/dL (ref 6.0–8.5)

## 2017-04-25 LAB — PSA, TOTAL AND FREE
PSA FREE PCT: 25 %
PSA, Free: 0.1 ng/mL
Prostate Specific Ag, Serum: 0.4 ng/mL (ref 0.0–4.0)

## 2017-04-25 LAB — LIPID PANEL
CHOLESTEROL TOTAL: 139 mg/dL (ref 100–199)
Chol/HDL Ratio: 3.1 ratio (ref 0.0–5.0)
HDL: 45 mg/dL (ref >39)
LDL CALC: 79 mg/dL (ref 0–99)
TRIGLYCERIDES: 75 mg/dL (ref 0–149)
VLDL CHOLESTEROL CAL: 15 mg/dL (ref 5–40)

## 2017-04-25 LAB — BMP8+EGFR
BUN/Creatinine Ratio: 14 (ref 10–24)
BUN: 14 mg/dL (ref 8–27)
CALCIUM: 9.6 mg/dL (ref 8.6–10.2)
CHLORIDE: 105 mmol/L (ref 96–106)
CO2: 25 mmol/L (ref 20–29)
Creatinine, Ser: 0.99 mg/dL (ref 0.76–1.27)
GFR, EST AFRICAN AMERICAN: 95 mL/min/{1.73_m2} (ref >59)
GFR, EST NON AFRICAN AMERICAN: 82 mL/min/{1.73_m2} (ref >59)
Glucose: 40 mg/dL — ABNORMAL LOW (ref 65–99)
Potassium: 3.9 mmol/L (ref 3.5–5.2)
Sodium: 143 mmol/L (ref 134–144)

## 2017-04-25 LAB — VITAMIN D 25 HYDROXY (VIT D DEFICIENCY, FRACTURES): Vit D, 25-Hydroxy: 50.3 ng/mL (ref 30.0–100.0)

## 2017-05-02 ENCOUNTER — Telehealth: Payer: Self-pay | Admitting: *Deleted

## 2017-05-02 NOTE — Telephone Encounter (Signed)
Pt notified of results Verbalizes understanding 

## 2017-05-02 NOTE — Telephone Encounter (Signed)
-----   Message from Ernestina Penna, MD sent at 04/25/2017 10:23 AM EDT ----- The hemoglobin A1c is good at 7.2%. Less than 7% would be even better. The patient should continue to work on his diet and exercise regimen and stay on his current treatment plan and make every effort to lose as much weight as possible. He should be reminded to let us know if his insurance will not pay for the Freestyle Libra so we can assist with the insurance that he needs this instrument to track his extremely variable blood sugars more easily. The blood sugar was low at 40.++++++ This is not good and is all the more reason that he needs to have a Freestyle Libra to help control and monitor his blood sugars more closely.++++++ The creatinine, the most important kidney function test was within normal limits. The electrolytes including potassium are good. The CBC has a normal white blood cell count. The hemoglobin is good at 14.1 and the platelet count is adequate. Cholesterol numbers with traditional lipid testing are all excellent and at goal and he should continue with current treatment All liver function tests are within normal limits The vitamin D level is good and within normal limits and he should continue with current treatment The PSA remains low and within normal limits and no further checks her needed until one year from now. The urinalysis result is still pending==== we will call with these results as soon as they are returned

## 2017-05-14 DIAGNOSIS — G4733 Obstructive sleep apnea (adult) (pediatric): Secondary | ICD-10-CM | POA: Diagnosis not present

## 2017-05-22 ENCOUNTER — Other Ambulatory Visit: Payer: Self-pay | Admitting: Family Medicine

## 2017-06-17 ENCOUNTER — Ambulatory Visit: Payer: BLUE CROSS/BLUE SHIELD | Admitting: *Deleted

## 2017-06-17 DIAGNOSIS — Z23 Encounter for immunization: Secondary | ICD-10-CM

## 2017-06-17 NOTE — Progress Notes (Signed)
Pt given 3rd Hep B vaccine Tolerated well 

## 2017-07-01 DIAGNOSIS — E559 Vitamin D deficiency, unspecified: Secondary | ICD-10-CM | POA: Diagnosis not present

## 2017-07-01 DIAGNOSIS — Z6836 Body mass index (BMI) 36.0-36.9, adult: Secondary | ICD-10-CM | POA: Diagnosis not present

## 2017-07-01 DIAGNOSIS — E039 Hypothyroidism, unspecified: Secondary | ICD-10-CM | POA: Diagnosis not present

## 2017-07-01 DIAGNOSIS — Z008 Encounter for other general examination: Secondary | ICD-10-CM | POA: Diagnosis not present

## 2017-07-01 DIAGNOSIS — E119 Type 2 diabetes mellitus without complications: Secondary | ICD-10-CM | POA: Diagnosis not present

## 2017-07-09 DIAGNOSIS — G4733 Obstructive sleep apnea (adult) (pediatric): Secondary | ICD-10-CM | POA: Diagnosis not present

## 2017-07-22 ENCOUNTER — Telehealth: Payer: Self-pay | Admitting: Family Medicine

## 2017-07-22 DIAGNOSIS — Z7689 Persons encountering health services in other specified circumstances: Secondary | ICD-10-CM | POA: Diagnosis not present

## 2017-07-22 DIAGNOSIS — E669 Obesity, unspecified: Secondary | ICD-10-CM | POA: Diagnosis not present

## 2017-07-22 DIAGNOSIS — Z6835 Body mass index (BMI) 35.0-35.9, adult: Secondary | ICD-10-CM | POA: Diagnosis not present

## 2017-07-23 MED ORDER — NYSTATIN-TRIAMCINOLONE 100000-0.1 UNIT/GM-% EX CREA: 1.0000 "application " | TOPICAL_CREAM | Freq: Two times a day (BID) | CUTANEOUS | 1 refills | Status: DC

## 2017-08-05 ENCOUNTER — Other Ambulatory Visit: Payer: Self-pay | Admitting: Family Medicine

## 2017-08-07 DIAGNOSIS — E669 Obesity, unspecified: Secondary | ICD-10-CM | POA: Diagnosis not present

## 2017-08-07 DIAGNOSIS — Z7689 Persons encountering health services in other specified circumstances: Secondary | ICD-10-CM | POA: Diagnosis not present

## 2017-08-07 DIAGNOSIS — Z6835 Body mass index (BMI) 35.0-35.9, adult: Secondary | ICD-10-CM | POA: Diagnosis not present

## 2017-08-26 ENCOUNTER — Encounter (INDEPENDENT_AMBULATORY_CARE_PROVIDER_SITE_OTHER): Payer: Self-pay

## 2017-08-26 ENCOUNTER — Ambulatory Visit: Payer: BLUE CROSS/BLUE SHIELD | Admitting: Family Medicine

## 2017-08-26 ENCOUNTER — Ambulatory Visit (INDEPENDENT_AMBULATORY_CARE_PROVIDER_SITE_OTHER): Payer: BLUE CROSS/BLUE SHIELD

## 2017-08-26 ENCOUNTER — Encounter: Payer: Self-pay | Admitting: Family Medicine

## 2017-08-26 VITALS — BP 94/51 | HR 67 | Temp 97.3°F | Ht 71.0 in | Wt 259.0 lb

## 2017-08-26 DIAGNOSIS — E559 Vitamin D deficiency, unspecified: Secondary | ICD-10-CM | POA: Diagnosis not present

## 2017-08-26 DIAGNOSIS — E118 Type 2 diabetes mellitus with unspecified complications: Secondary | ICD-10-CM | POA: Diagnosis not present

## 2017-08-26 DIAGNOSIS — I10 Essential (primary) hypertension: Secondary | ICD-10-CM

## 2017-08-26 DIAGNOSIS — Z0000 Encounter for general adult medical examination without abnormal findings: Secondary | ICD-10-CM | POA: Diagnosis not present

## 2017-08-26 DIAGNOSIS — E7800 Pure hypercholesterolemia, unspecified: Secondary | ICD-10-CM

## 2017-08-26 MED ORDER — INSULIN NPH (HUMAN) (ISOPHANE) 100 UNIT/ML ~~LOC~~ SUSP: SUBCUTANEOUS | 3 refills | Status: DC

## 2017-08-26 MED ORDER — INSULIN REGULAR HUMAN 100 UNIT/ML IJ SOLN: INTRAMUSCULAR | 3 refills | Status: DC

## 2017-08-26 NOTE — Progress Notes (Signed)
Subjective:    Patient ID: Joseph Kirby, male    DOB: 03/11/1956, 61 y.o.   MRN: [id number]  HPI Pt here for follow up and management of chronic medical problems which includes diabetes, hyperlipidemia and hypertension. He is taking medication regularly.  Marya Amsler is doing well and as usual nights.  He is on insulin and is insulin-dependent.  He is due to get a chest x-ray today return and FOBT and he will return to the office to get fasting lab work.  His vital signs are stable and his blood pressure continues to run low as it has in the past.  His weight is up 2 pounds from previously.  She denies any chest pain or shortness of breath.  He denies any trouble with his stomach other than some loose bowel movements for the past couple of days and we have already gone ahead and talked him about drinking more fluids avoiding caffeine milk cheese ice cream and dairy products and eating more bland diet for the next few days and he understands the importance of doing this.  He denies any blood in the stool or black tarry bowel movements and has no nausea or vomiting.  He is passing his water without problems.  He does not have an endocrinologist as his endocrinologist retired and he is going to let us know of someone that 1 of his friends sees in [place] so that we can set up an appointment with that endocrinologist to follow him more closely.  Says that his blood sugars at home fasting usually run between 70 and 100.    Patient Active Problem List   Diagnosis Date Noted  . Diabetes, insulin-dependent 06/20/2012  . CAD (coronary artery disease)   . Hypertension   . Primary hypothyroidism   . Ejection fraction   . DKA (diabetic ketoacidoses), history of 03/17/2012  . BPH (benign prostatic hyperplasia)   . Colon polyp   . DYSLIPIDEMIA 03/27/2010   Outpatient Encounter Medications as of 08/26/2017  Medication Sig  . ALTACE 5 MG capsule TAKE 1 CAPSULE BY MOUTH  DAILY  . aspirin 81 MG tablet Take 81  mg by mouth daily.    Marland Kitchen atorvastatin (LIPITOR) 80 MG tablet TAKE 1 TABLET BY MOUTH  DAILY  . Cholecalciferol (VITAMIN D) 2000 UNITS CAPS Take 2 capsules by mouth daily.  . Continuous Blood Gluc Receiver (FREESTYLE LIBRE READER) DEVI 1 each by Does not apply route daily.  . Continuous Blood Gluc Sensor (FREESTYLE LIBRE SENSOR SYSTEM) MISC APPLY 1 SENSOR SUBCUTANEOUSLY EVERY 10 DAYS  . HUMULIN N 100 UNIT/ML injection INJECT SUBCUTANEOUSLY 50  UNITS IN THE MORNING AND 40 UNITS IN THE EVENING AS  DIRECTED (Patient taking differently: INJECT SUBCUTANEOUSLY 56  UNITS IN THE MORNING AND 46 UNITS IN THE EVENING AS  DIRECTED)  . HUMULIN R 100 UNIT/ML injection INJECT 10 UNITS INTO THE  SKIN 2 TIMES DAILY BEFORE A MEAL.  Marland Kitchen Insulin Syringe-Needle U-100 (B-D INS SYR ULTRAFINE 1CC/30G) 30G X 1/2" 1 ML MISC Use as directed three times a day  . levothyroxine (SYNTHROID, LEVOTHROID) 175 MCG tablet TAKE 1 TABLET DAILY. NONE  ON SUNDAYS.  Marland Kitchen niacin (NIASPAN) 500 MG CR tablet TAKE 3 TABLETS BY MOUTH AT  BEDTIME  . nystatin-triamcinolone (MYCOLOG II) cream Apply 1 application 2 (two) times daily topically.  Marland Kitchen omega-3 acid ethyl esters (LOVAZA) 1 g capsule Take 2 capsules (2 g total) by mouth 2 (two) times daily.  . ONE TOUCH ULTRA TEST test  strip TEST TWO TIMES DAILY  . ranitidine (ZANTAC) 150 MG tablet Take 1 tablet by mouth two  times daily  . sildenafil (REVATIO) 20 MG tablet Take 2-5 tabs PRN prior to Sexual activity.   No facility-administered encounter medications on file as of 08/26/2017.       Review of Systems  Constitutional: Negative.   HENT: Negative.   Eyes: Negative.   Respiratory: Negative.   Cardiovascular: Negative.   Gastrointestinal: Negative.   Endocrine: Negative.   Genitourinary: Negative.   Musculoskeletal: Negative.   Skin: Negative.   Allergic/Immunologic: Negative.   Neurological: Negative.   Hematological: Negative.   Psychiatric/Behavioral: Negative.        Objective:    Physical Exam  Constitutional: He is oriented to person, place, and time. He appears well-developed and well-nourished. No distress.  The patient is pleasant and relaxed and has a laid-back personality.  HENT:  Head: Normocephalic and atraumatic.  Right Ear: External ear normal.  Left Ear: External ear normal.  Nose: Nose normal.  Mouth/Throat: Oropharynx is clear and moist. No oropharyngeal exudate.  Eyes: Conjunctivae and EOM are normal. Pupils are equal, round, and reactive to light. Right eye exhibits no discharge. Left eye exhibits no discharge. No scleral icterus.  Neck: Normal range of motion. Neck supple. No thyromegaly present.  No bruits thyromegaly or anterior cervical adenopathy  Cardiovascular: Normal rate, regular rhythm, normal heart sounds and intact distal pulses.  No murmur heard. Heart is regular at 72/min  Pulmonary/Chest: Effort normal and breath sounds normal. No respiratory distress. He has no wheezes. He has no rales. He exhibits no tenderness.  Clear anteriorly and posteriorly no axillary adenopathy  Abdominal: Soft. Bowel sounds are normal. He exhibits no mass. There is no tenderness. There is no rebound and no guarding.  Abdominal obesity without masses tenderness or organ enlargement or bruits  Musculoskeletal: Normal range of motion. He exhibits no edema.  Lymphadenopathy:    He has no cervical adenopathy.  Neurological: He is alert and oriented to person, place, and time. He has normal reflexes. No cranial nerve deficit.  Skin: Skin is warm and dry. No rash noted.  Psychiatric: He has a normal mood and affect. His behavior is normal. Judgment and thought content normal.  Nursing note and vitals reviewed.   BP (!) 94/51 (BP Location: Left Arm)   Pulse 67   Temp (!) 97.3 F (36.3 C) (Oral)   Ht 5' 11"  (1.803 m)   Wt 259 lb (117.5 kg)   BMI 36.12 kg/m        Assessment & Plan:  1. Pure hypercholesterolemia -Continue current treatment and aggressive  therapeutic lifestyle changes - DG Chest 2 View; Future - CBC with Differential/Platelet; Future - Lipid panel; Future  2. Essential hypertension -Patient's blood pressure runs on the low side and he will continue with current treatment - DG Chest 2 View; Future - BMP8+EGFR; Future - CBC with Differential/Platelet; Future - Hepatic function panel; Future  3. Vitamin D deficiency -Continue vitamin D replacement pending results of lab work - CBC with Differential/Platelet; Future - VITAMIN D 25 Hydroxy (Vit-D Deficiency, Fractures); Future  4. Type 2 diabetes mellitus without complication with long-term use of insulin -Patient will let us know if we need to do a referral to an endocrinologist in [place].  He has the name of one group and he will let us know about that and if he cannot come up with a group he needs to call us back so  we can do this referral for ongoing follow-up of his diabetes which in the past has been somewhat brittle. - DG Chest 2 View; Future - BMP8+EGFR; Future - CBC with Differential/Platelet; Future - Bayer DCA Hb A1c Waived; Future  5.  Euthyroid -Check thyroid profile and continue current treatment pending results of lab work  Meds ordered this encounter  Medications  . insulin regular (HUMULIN R) 100 units/mL injection    Sig: INJECT 20 UNITS INTO THE  SKIN 2 TIMES DAILY BEFORE A MEAL.    Dispense:  40 mL    Refill:  3  . insulin NPH Human (HUMULIN N) 100 UNIT/ML injection    Sig: INJECT SUBCUTANEOUSLY 56  UNITS IN THE MORNING AND 46 UNITS IN THE EVENING AS  DIRECTED    Dispense:  100 mL    Refill:  3   Patient Instructions  Continue current medications. Continue good therapeutic lifestyle changes which include good diet and exercise. Fall precautions discussed with patient. If an FOBT was given today- please return it to our front desk. If you are over 33 years old - you may need Prevnar 77 or the adult Pneumonia vaccine.  **Flu shots are  available--- please call and schedule a FLU-CLINIC appointment**  After your visit with [place] today you will receive a survey in the mail or online from [employer] regarding your care with [place]. Please take a moment to fill this out. Your feedback is very important to [place] as you can help [place] better understand your patient needs as well as improve your experience and satisfaction. WE CARE ABOUT YOU!!!   Please call us with the name of the endocrinologist that you may be interested in seeing in [place] and if you cannot come up with the name, let us know and we will find one for you. Continue to check blood sugars regularly.  Arrie Senate MD

## 2017-08-26 NOTE — Patient Instructions (Addendum)
Continue current medications. Continue good therapeutic lifestyle changes which include good diet and exercise. Fall precautions discussed with patient. If an FOBT was given today- please return it to our front desk. If you are over 60 years old - you may need Prevnar 13 or the adult Pneumonia vaccine.  **Flu shots are available--- please call and schedule a FLU-CLINIC appointment**  After your visit with us today you will receive a survey in the mail or online from [employer] regarding your care with us. Please take a moment to fill this out. Your feedback is very important to us as you can help us better understand your patient needs as well as improve your experience and satisfaction. WE CARE ABOUT YOU!!!   Please call us with the name of the endocrinologist that you may be interested in seeing in [place] and if you cannot come up with the name, let us know and we will find one for you. Continue to check blood sugars regularly. Follow aggressive therapeutic lifestyle changes Patient should also find out what kind of intestinal cancer his brother has who lives in [place] This patient may need a colonoscopy soon.  We will look into this further for him.

## 2017-08-28 DIAGNOSIS — E669 Obesity, unspecified: Secondary | ICD-10-CM | POA: Diagnosis not present

## 2017-08-28 DIAGNOSIS — Z6835 Body mass index (BMI) 35.0-35.9, adult: Secondary | ICD-10-CM | POA: Diagnosis not present

## 2017-08-28 DIAGNOSIS — Z7689 Persons encountering health services in other specified circumstances: Secondary | ICD-10-CM | POA: Diagnosis not present

## 2017-09-04 ENCOUNTER — Other Ambulatory Visit: Payer: BLUE CROSS/BLUE SHIELD

## 2017-09-04 DIAGNOSIS — E118 Type 2 diabetes mellitus with unspecified complications: Secondary | ICD-10-CM

## 2017-09-04 DIAGNOSIS — E7800 Pure hypercholesterolemia, unspecified: Secondary | ICD-10-CM | POA: Diagnosis not present

## 2017-09-04 DIAGNOSIS — E559 Vitamin D deficiency, unspecified: Secondary | ICD-10-CM | POA: Diagnosis not present

## 2017-09-04 DIAGNOSIS — I10 Essential (primary) hypertension: Secondary | ICD-10-CM

## 2017-09-04 LAB — BAYER DCA HB A1C WAIVED: HB A1C: 7.2 % — AB (ref <7.0)

## 2017-09-05 LAB — CBC WITH DIFFERENTIAL/PLATELET
BASOS ABS: 0 10*3/uL (ref 0.0–0.2)
Basos: 1 %
EOS (ABSOLUTE): 0.3 10*3/uL (ref 0.0–0.4)
Eos: 5 %
Hematocrit: 41.6 % (ref 37.5–51.0)
Hemoglobin: 14.4 g/dL (ref 13.0–17.7)
Immature Grans (Abs): 0 10*3/uL (ref 0.0–0.1)
Immature Granulocytes: 0 %
LYMPHS ABS: 1.2 10*3/uL (ref 0.7–3.1)
Lymphs: 22 %
MCH: 28.8 pg (ref 26.6–33.0)
MCHC: 34.6 g/dL (ref 31.5–35.7)
MCV: 83 fL (ref 79–97)
MONOS ABS: 0.6 10*3/uL (ref 0.1–0.9)
Monocytes: 11 %
NEUTROS ABS: 3.5 10*3/uL (ref 1.4–7.0)
Neutrophils: 61 %
Platelets: 231 10*3/uL (ref 150–379)
RBC: 5 x10E6/uL (ref 4.14–5.80)
RDW: 13.9 % (ref 12.3–15.4)
WBC: 5.8 10*3/uL (ref 3.4–10.8)

## 2017-09-05 LAB — BMP8+EGFR
BUN / CREAT RATIO: 11 (ref 10–24)
BUN: 12 mg/dL (ref 8–27)
CO2: 27 mmol/L (ref 20–29)
Calcium: 9.4 mg/dL (ref 8.6–10.2)
Chloride: 103 mmol/L (ref 96–106)
Creatinine, Ser: 1.06 mg/dL (ref 0.76–1.27)
GFR calc Af Amer: 88 mL/min/{1.73_m2} (ref >59)
GFR, EST NON AFRICAN AMERICAN: 76 mL/min/{1.73_m2} (ref >59)
GLUCOSE: 211 mg/dL — AB (ref 65–99)
POTASSIUM: 4.4 mmol/L (ref 3.5–5.2)
SODIUM: 140 mmol/L (ref 134–144)

## 2017-09-05 LAB — HEPATIC FUNCTION PANEL
ALBUMIN: 3.9 g/dL (ref 3.6–4.8)
ALT: 29 IU/L (ref 0–44)
AST: 24 IU/L (ref 0–40)
Alkaline Phosphatase: 105 IU/L (ref 39–117)
BILIRUBIN TOTAL: 0.5 mg/dL (ref 0.0–1.2)
BILIRUBIN, DIRECT: 0.15 mg/dL (ref 0.00–0.40)
TOTAL PROTEIN: 6 g/dL (ref 6.0–8.5)

## 2017-09-05 LAB — LIPID PANEL
CHOL/HDL RATIO: 2.8 ratio (ref 0.0–5.0)
Cholesterol, Total: 121 mg/dL (ref 100–199)
HDL: 43 mg/dL (ref >39)
LDL CALC: 68 mg/dL (ref 0–99)
Triglycerides: 52 mg/dL (ref 0–149)
VLDL CHOLESTEROL CAL: 10 mg/dL (ref 5–40)

## 2017-09-05 LAB — VITAMIN D 25 HYDROXY (VIT D DEFICIENCY, FRACTURES): Vit D, 25-Hydroxy: 48.2 ng/mL (ref 30.0–100.0)

## 2017-09-17 DIAGNOSIS — G4733 Obstructive sleep apnea (adult) (pediatric): Secondary | ICD-10-CM | POA: Diagnosis not present

## 2017-09-25 DIAGNOSIS — Z6835 Body mass index (BMI) 35.0-35.9, adult: Secondary | ICD-10-CM | POA: Diagnosis not present

## 2017-09-25 DIAGNOSIS — Z719 Counseling, unspecified: Secondary | ICD-10-CM | POA: Diagnosis not present

## 2017-09-25 DIAGNOSIS — Z7689 Persons encountering health services in other specified circumstances: Secondary | ICD-10-CM | POA: Diagnosis not present

## 2017-09-25 DIAGNOSIS — Z008 Encounter for other general examination: Secondary | ICD-10-CM | POA: Diagnosis not present

## 2017-09-25 DIAGNOSIS — E559 Vitamin D deficiency, unspecified: Secondary | ICD-10-CM | POA: Diagnosis not present

## 2017-09-25 DIAGNOSIS — E119 Type 2 diabetes mellitus without complications: Secondary | ICD-10-CM | POA: Diagnosis not present

## 2017-09-25 DIAGNOSIS — E039 Hypothyroidism, unspecified: Secondary | ICD-10-CM | POA: Diagnosis not present

## 2017-09-25 DIAGNOSIS — E669 Obesity, unspecified: Secondary | ICD-10-CM | POA: Diagnosis not present

## 2017-10-30 ENCOUNTER — Other Ambulatory Visit: Payer: Self-pay | Admitting: Family Medicine

## 2017-11-07 LAB — HM DIABETES EYE EXAM

## 2017-11-12 ENCOUNTER — Other Ambulatory Visit: Payer: Self-pay | Admitting: Family Medicine

## 2017-11-12 MED ORDER — RAMIPRIL 5 MG PO CAPS: 5.0000 mg | ORAL_CAPSULE | Freq: Every day | ORAL | 0 refills | Status: DC

## 2017-11-27 DIAGNOSIS — E669 Obesity, unspecified: Secondary | ICD-10-CM | POA: Diagnosis not present

## 2017-11-27 DIAGNOSIS — Z6836 Body mass index (BMI) 36.0-36.9, adult: Secondary | ICD-10-CM | POA: Diagnosis not present

## 2017-11-27 DIAGNOSIS — Z713 Dietary counseling and surveillance: Secondary | ICD-10-CM | POA: Diagnosis not present

## 2017-12-30 ENCOUNTER — Encounter: Payer: Self-pay | Admitting: Family Medicine

## 2017-12-30 ENCOUNTER — Ambulatory Visit: Payer: BLUE CROSS/BLUE SHIELD | Admitting: Family Medicine

## 2017-12-30 VITALS — BP 95/54 | HR 72 | Temp 97.9°F | Ht 71.0 in | Wt 255.0 lb

## 2017-12-30 DIAGNOSIS — E109 Type 1 diabetes mellitus without complications: Secondary | ICD-10-CM | POA: Diagnosis not present

## 2017-12-30 DIAGNOSIS — E1169 Type 2 diabetes mellitus with other specified complication: Secondary | ICD-10-CM

## 2017-12-30 DIAGNOSIS — E7800 Pure hypercholesterolemia, unspecified: Secondary | ICD-10-CM

## 2017-12-30 DIAGNOSIS — G4730 Sleep apnea, unspecified: Secondary | ICD-10-CM | POA: Diagnosis not present

## 2017-12-30 DIAGNOSIS — E785 Hyperlipidemia, unspecified: Secondary | ICD-10-CM

## 2017-12-30 DIAGNOSIS — E559 Vitamin D deficiency, unspecified: Secondary | ICD-10-CM | POA: Diagnosis not present

## 2017-12-30 DIAGNOSIS — Z23 Encounter for immunization: Secondary | ICD-10-CM | POA: Diagnosis not present

## 2017-12-30 DIAGNOSIS — N400 Enlarged prostate without lower urinary tract symptoms: Secondary | ICD-10-CM | POA: Diagnosis not present

## 2017-12-30 DIAGNOSIS — E118 Type 2 diabetes mellitus with unspecified complications: Secondary | ICD-10-CM | POA: Diagnosis not present

## 2017-12-30 DIAGNOSIS — I10 Essential (primary) hypertension: Secondary | ICD-10-CM

## 2017-12-30 LAB — BAYER DCA HB A1C WAIVED: HB A1C: 7.5 % — AB (ref <7.0)

## 2017-12-30 NOTE — Progress Notes (Signed)
Subjective:    Patient ID: Joseph Kirby, male    DOB: September 19, 1955, 62 y.o.   MRN: [id number]  HPI Pt here for follow up and management of chronic medical problems which includes diabetes, hypertension and hyperlipidemia. He is taking medication regularly.  The patient is doing well today and has no specific complaints.  He is due to get lab work he will be given an FOBT and will get a Tdap.  He also get a urine microalbumin.  His last colonoscopy was 10 years ago we will make sure that this gets followed up with also.  The patient is using a Freestyle libre blood sugar monitor.  He is on insulin and takes thyroid medication and he is taking medicine for his cholesterol.  The patient is no longer seeing the endocrinologist because he retired.  We did recommend that he continue to follow-up periodically with the endocrinologist because of his brittle diabetes.  The patient denies any chest pain pressure or tightness and has seen the cardiologist in the past because of his history of diabetes we will try to find out for him when he needs to be seen again because of having diabetes.  He denies any shortness of breath other than the congestion he seems to be getting from the pollen.  He denies any trouble with his stomach including nausea vomiting diarrhea blood in the stool or black tarry bowel movements.  He is due to get his colonoscopy and we will schedule him for a routine colonoscopy.  He is passing his water without problems.  He denies any severe fluctuations currently with his diabetes and has not had any severe episodes of hypoglycemia or hyperglycemia that he can tell me about.  He gets his eye exams yearly and his last one was done in February or March of this year.  The patient is considered morbidly obese because he has 2 or more comorbid risk factors with a BMI greater than 35.  The patient is concerned about his CPAP equipment and we will try to get him phone connection so he can check with the  people that supply his CPAP and get adjustments made to his equipment by them.  We will also encourage him to follow-up with his endocrinologist periodically.    Patient Active Problem List   Diagnosis Date Noted  . Diabetes, insulin-dependent 06/20/2012  . CAD (coronary artery disease)   . Hypertension   . Primary hypothyroidism   . Ejection fraction   . DKA (diabetic ketoacidoses), history of 03/17/2012  . BPH (benign prostatic hyperplasia)   . Colon polyp   . DYSLIPIDEMIA 03/27/2010   Outpatient Encounter Medications as of 12/30/2017  Medication Sig  . aspirin 81 MG tablet Take 81 mg by mouth daily.    Marland Kitchen atorvastatin (LIPITOR) 80 MG tablet TAKE 1 TABLET BY MOUTH  DAILY  . Cholecalciferol (VITAMIN D) 2000 UNITS CAPS Take 2 capsules by mouth daily.  . Continuous Blood Gluc Receiver (FREESTYLE LIBRE READER) DEVI 1 each by Does not apply route daily.  . Continuous Blood Gluc Sensor (FREESTYLE LIBRE SENSOR SYSTEM) MISC APPLY 1 SENSOR SUBCUTANEOUSLY EVERY 10 DAYS  . insulin NPH Human (HUMULIN N) 100 UNIT/ML injection INJECT SUBCUTANEOUSLY 56  UNITS IN THE MORNING AND 46 UNITS IN THE EVENING AS  DIRECTED  . insulin regular (HUMULIN R) 100 units/mL injection INJECT 20 UNITS INTO THE  SKIN 2 TIMES DAILY BEFORE A MEAL.  Marland Kitchen Insulin Syringe-Needle U-100 (B-D INS SYR ULTRAFINE 1CC/30G) 30G X  1/2" 1 ML MISC USE AS DIRECTED 3 TIMES PER DAY  . levothyroxine (SYNTHROID, LEVOTHROID) 175 MCG tablet TAKE 1 TABLET BY MOUTH  DAILY , NONE ON SUNDAY  . niacin (NIASPAN) 500 MG CR tablet TAKE 3 TABLETS BY MOUTH AT  BEDTIME  . nystatin-triamcinolone (MYCOLOG II) cream Apply 1 application 2 (two) times daily topically.  Marland Kitchen omega-3 acid ethyl esters (LOVAZA) 1 g capsule Take 2 capsules (2 g total) by mouth 2 (two) times daily.  . ONE TOUCH ULTRA TEST test strip TEST TWO TIMES DAILY  . ramipril (ALTACE) 5 MG capsule Take 1 capsule (5 mg total) by mouth daily.  . ranitidine (ZANTAC) 150 MG tablet Take 1 tablet by  mouth two  times daily  . sildenafil (REVATIO) 20 MG tablet Take 2-5 tabs PRN prior to Sexual activity.   No facility-administered encounter medications on file as of 12/30/2017.      Review of Systems  Constitutional: Negative.   HENT: Negative.   Eyes: Negative.   Respiratory: Negative.   Cardiovascular: Negative.   Gastrointestinal: Negative.   Endocrine: Negative.   Genitourinary: Negative.   Musculoskeletal: Negative.   Skin: Negative.   Allergic/Immunologic: Negative.   Neurological: Negative.   Hematological: Negative.   Psychiatric/Behavioral: Negative.        Objective:   Physical Exam  Constitutional: He is oriented to person, place, and time. He appears well-developed and well-nourished. No distress.  The patient is calm and relaxed as usual.  He is not using the FreeStyle labor because it cost too much.  HENT:  Head: Normocephalic and atraumatic.  Right Ear: External ear normal.  Left Ear: External ear normal.  Nose: Nose normal.  Mouth/Throat: Oropharynx is clear and moist. No oropharyngeal exudate.  Eyes: Pupils are equal, round, and reactive to light. Conjunctivae and EOM are normal. Right eye exhibits no discharge. Left eye exhibits no discharge. No scleral icterus.  Neck: Normal range of motion. Neck supple. No thyromegaly present.  No bruits thyromegaly or anterior cervical adenopathy  Cardiovascular: Normal rate, regular rhythm, normal heart sounds and intact distal pulses.  No murmur heard. Heart is regular at 72/min  Pulmonary/Chest: Effort normal and breath sounds normal. No respiratory distress. He has no wheezes. He has no rales. He exhibits no tenderness.  Lungs are clear anteriorly and posteriorly and there is no axillary adenopathy no chest wall masses  Abdominal: Soft. Bowel sounds are normal. He exhibits no mass. There is no tenderness. There is no rebound and no guarding.  Abdomen is obese without liver or spleen enlargement masses tenderness or  inguinal adenopathy or suprapubic tenderness.  Musculoskeletal: Normal range of motion. He exhibits no edema or deformity.  Lymphadenopathy:    He has no cervical adenopathy.  Neurological: He is alert and oriented to person, place, and time. He has normal reflexes. No cranial nerve deficit.  Skin: Skin is warm and dry. No rash noted.  Psychiatric: He has a normal mood and affect. His behavior is normal. Judgment and thought content normal.  Nursing note and vitals reviewed.  BP (!) 95/54 (BP Location: Left Arm)   Pulse 72   Temp 97.9 F (36.6 C) (Oral)   Ht 5' 11"  (1.803 m)   Wt 255 lb (115.7 kg)   BMI 35.57 kg/m         Assessment & Plan:  1. Pure hypercholesterolemia -Continue with current treatment pending results of lab work as this patient does have atherosclerotic heart disease. - CBC with Differential/Platelet -  Lipid panel  2. Essential hypertension -This patient's blood pressure has a tendency to run on the lower side and we will continue to monitor this.  - BMP8+EGFR - CBC with Differential/Platelet - Hepatic function panel  3. Vitamin D deficiency -Continue current treatment pending results of lab work - CBC with Differential/Platelet - VITAMIN D 25 Hydroxy (Vit-D Deficiency, Fractures)  4. Type 2 diabetes mellitus without complication with long-term use of insulin -Continue to monitor blood sugars regularly watch diet closely and lose as much weight as possible - BMP8+EGFR - CBC with Differential/Platelet - Bayer DCA Hb A1c Waived - Microalbumin / creatinine urine ratio  5. Benign prostatic hyperplasia, unspecified whether lower urinary tract symptoms present -No complaints today with voiding - CBC with Differential/Platelet  6. Morbid obesity ([place]) -The patient has morbid obesity based on BMI greater than 35 with 2 or more comorbid risk factors.  He was encouraged to work on the weight aggressively.  7. Brittle diabetes ([place]) -Monitor blood sugars  closely -Schedule appointment with endocrinologist for periodic follow-ups  8. Type 2 diabetes mellitus with hyperlipidemia ([facility]) -Continue with current treatment including insulin and statin drug  9. Sleep apnea, unspecified type -Refer to [place] for further evaluation to make adjustment with equipment so that it is easier to use.  Patient Instructions  Continue current medications. Continue good therapeutic lifestyle changes which include good diet and exercise. Fall precautions discussed with patient. If an FOBT was given today- please return it to our front desk. If you are over 19 years old - you may need Prevnar 68 or the adult Pneumonia vaccine.  **Flu shots are available--- please call and schedule a FLU-CLINIC appointment**  After your visit with [place] today you will receive a survey in the mail or online from [employer] regarding your care with [place]. Please take a moment to fill this out. Your feedback is very important to [place] as you can help [place] better understand your patient needs as well as improve your experience and satisfaction. WE CARE ABOUT YOU!!!     Arrie Senate MD

## 2017-12-30 NOTE — Patient Instructions (Signed)
Continue current medications. Continue good therapeutic lifestyle changes which include good diet and exercise. Fall precautions discussed with patient. If an FOBT was given today- please return it to our front desk. If you are over 61 years old - you may need Prevnar 13 or the adult Pneumonia vaccine.  **Flu shots are available--- please call and schedule a FLU-CLINIC appointment**  After your visit with us today you will receive a survey in the mail or online from [employer] regarding your care with us. Please take a moment to fill this out. Your feedback is very important to us as you can help us better understand your patient needs as well as improve your experience and satisfaction. WE CARE ABOUT YOU!!!    

## 2017-12-31 LAB — MICROALBUMIN / CREATININE URINE RATIO
CREATININE, UR: 150.7 mg/dL
Microalb/Creat Ratio: <2 mg/g creat (ref 0.0–30.0)
Microalbumin, Urine: <3 ug/mL

## 2017-12-31 LAB — BMP8+EGFR
BUN/Creatinine Ratio: 15 (ref 10–24)
BUN: 14 mg/dL (ref 8–27)
CO2: 23 mmol/L (ref 20–29)
CREATININE: 0.92 mg/dL (ref 0.76–1.27)
Calcium: 9.5 mg/dL (ref 8.6–10.2)
Chloride: 104 mmol/L (ref 96–106)
GFR calc Af Amer: 104 mL/min/{1.73_m2} (ref >59)
GFR calc non Af Amer: 90 mL/min/{1.73_m2} (ref >59)
GLUCOSE: 97 mg/dL (ref 65–99)
Potassium: 4 mmol/L (ref 3.5–5.2)
Sodium: 141 mmol/L (ref 134–144)

## 2017-12-31 LAB — HEPATIC FUNCTION PANEL
ALT: 50 IU/L — AB (ref 0–44)
AST: 38 IU/L (ref 0–40)
Albumin: 3.8 g/dL (ref 3.6–4.8)
Alkaline Phosphatase: 113 IU/L (ref 39–117)
Bilirubin Total: 0.4 mg/dL (ref 0.0–1.2)
Bilirubin, Direct: 0.14 mg/dL (ref 0.00–0.40)
Total Protein: 6.1 g/dL (ref 6.0–8.5)

## 2017-12-31 LAB — CBC WITH DIFFERENTIAL/PLATELET
Basophils Absolute: 0 10*3/uL (ref 0.0–0.2)
Basos: 0 %
EOS (ABSOLUTE): 0.2 10*3/uL (ref 0.0–0.4)
Eos: 4 %
HEMATOCRIT: 41.9 % (ref 37.5–51.0)
HEMOGLOBIN: 14.1 g/dL (ref 13.0–17.7)
IMMATURE GRANS (ABS): 0 10*3/uL (ref 0.0–0.1)
IMMATURE GRANULOCYTES: 0 %
Lymphocytes Absolute: 1.7 10*3/uL (ref 0.7–3.1)
Lymphs: 30 %
MCH: 28.1 pg (ref 26.6–33.0)
MCHC: 33.7 g/dL (ref 31.5–35.7)
MCV: 84 fL (ref 79–97)
MONOCYTES: 10 %
Monocytes Absolute: 0.5 10*3/uL (ref 0.1–0.9)
NEUTROS PCT: 56 %
Neutrophils Absolute: 3 10*3/uL (ref 1.4–7.0)
Platelets: 233 10*3/uL (ref 150–379)
RBC: 5.01 x10E6/uL (ref 4.14–5.80)
RDW: 13.5 % (ref 12.3–15.4)
WBC: 5.5 10*3/uL (ref 3.4–10.8)

## 2017-12-31 LAB — LIPID PANEL
CHOL/HDL RATIO: 3.3 ratio (ref 0.0–5.0)
Cholesterol, Total: 134 mg/dL (ref 100–199)
HDL: 41 mg/dL (ref >39)
LDL CALC: 71 mg/dL (ref 0–99)
Triglycerides: 112 mg/dL (ref 0–149)
VLDL Cholesterol Cal: 22 mg/dL (ref 5–40)

## 2017-12-31 LAB — VITAMIN D 25 HYDROXY (VIT D DEFICIENCY, FRACTURES): Vit D, 25-Hydroxy: 44.2 ng/mL (ref 30.0–100.0)

## 2018-01-06 ENCOUNTER — Other Ambulatory Visit: Payer: Self-pay | Admitting: Family Medicine

## 2018-01-27 DIAGNOSIS — E669 Obesity, unspecified: Secondary | ICD-10-CM | POA: Diagnosis not present

## 2018-01-27 DIAGNOSIS — Z6836 Body mass index (BMI) 36.0-36.9, adult: Secondary | ICD-10-CM | POA: Diagnosis not present

## 2018-01-27 DIAGNOSIS — Z719 Counseling, unspecified: Secondary | ICD-10-CM | POA: Diagnosis not present

## 2018-01-27 DIAGNOSIS — E559 Vitamin D deficiency, unspecified: Secondary | ICD-10-CM | POA: Diagnosis not present

## 2018-01-27 DIAGNOSIS — E039 Hypothyroidism, unspecified: Secondary | ICD-10-CM | POA: Diagnosis not present

## 2018-01-27 DIAGNOSIS — Z008 Encounter for other general examination: Secondary | ICD-10-CM | POA: Diagnosis not present

## 2018-01-27 DIAGNOSIS — E119 Type 2 diabetes mellitus without complications: Secondary | ICD-10-CM | POA: Diagnosis not present

## 2018-02-26 DIAGNOSIS — H60502 Unspecified acute noninfective otitis externa, left ear: Secondary | ICD-10-CM | POA: Diagnosis not present

## 2018-02-26 DIAGNOSIS — Z6836 Body mass index (BMI) 36.0-36.9, adult: Secondary | ICD-10-CM | POA: Diagnosis not present

## 2018-02-26 DIAGNOSIS — Z7689 Persons encountering health services in other specified circumstances: Secondary | ICD-10-CM | POA: Diagnosis not present

## 2018-02-26 DIAGNOSIS — E669 Obesity, unspecified: Secondary | ICD-10-CM | POA: Diagnosis not present

## 2018-03-17 DIAGNOSIS — E669 Obesity, unspecified: Secondary | ICD-10-CM | POA: Diagnosis not present

## 2018-03-17 DIAGNOSIS — E559 Vitamin D deficiency, unspecified: Secondary | ICD-10-CM | POA: Diagnosis not present

## 2018-03-17 DIAGNOSIS — E119 Type 2 diabetes mellitus without complications: Secondary | ICD-10-CM | POA: Diagnosis not present

## 2018-03-17 DIAGNOSIS — Z008 Encounter for other general examination: Secondary | ICD-10-CM | POA: Diagnosis not present

## 2018-04-01 IMAGING — DX DG FOOT COMPLETE 3+V*R*
3 series · 3 of 3 positions shown · non-contrast
Comparison: None.

CLINICAL DATA: Dropped a piece of metal [DATE] toe 2 months ago with
pain, initial encounter

EXAM:
RIGHT FOOT COMPLETE - 3+ VIEW

[foot ap]
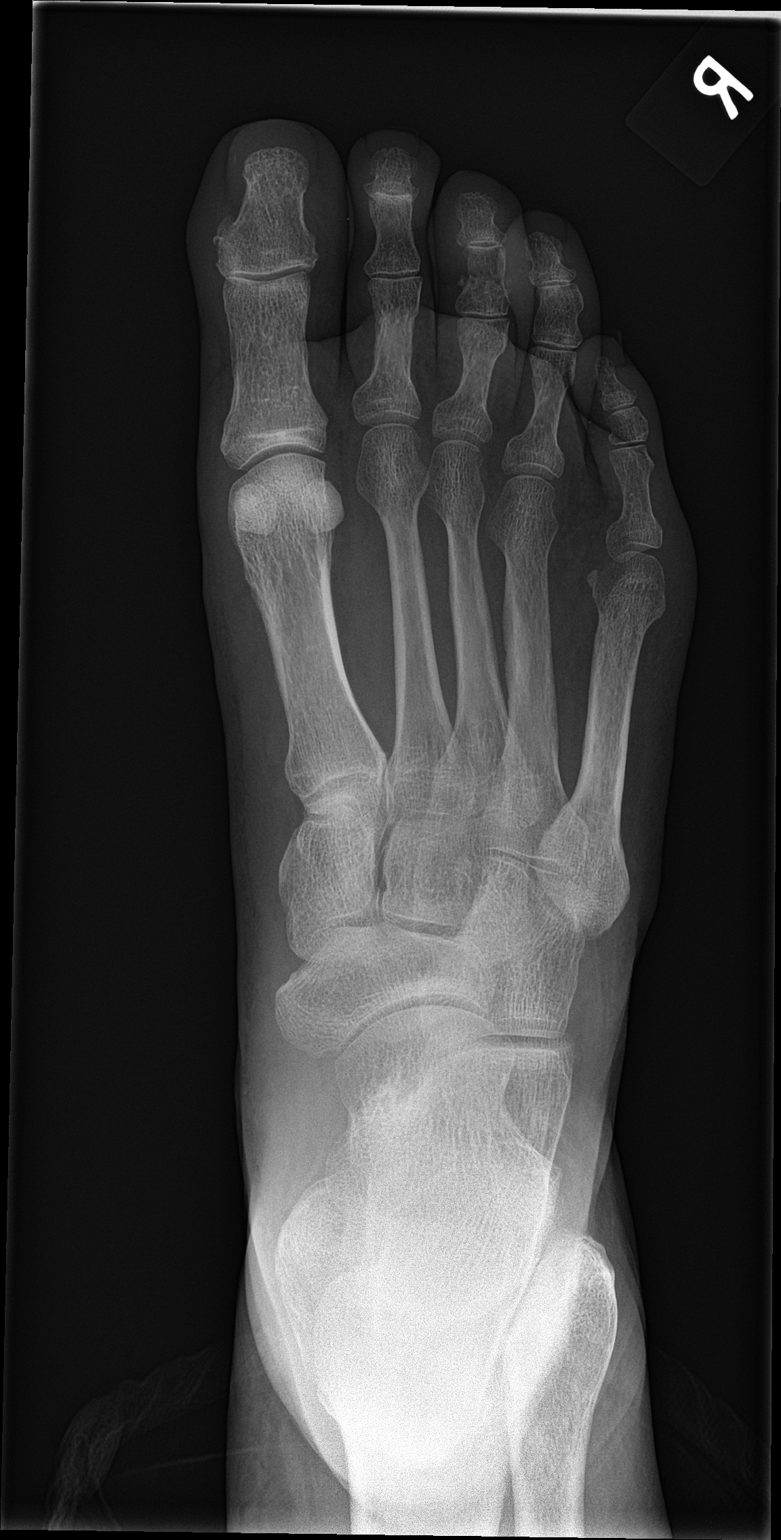

[foot obl]
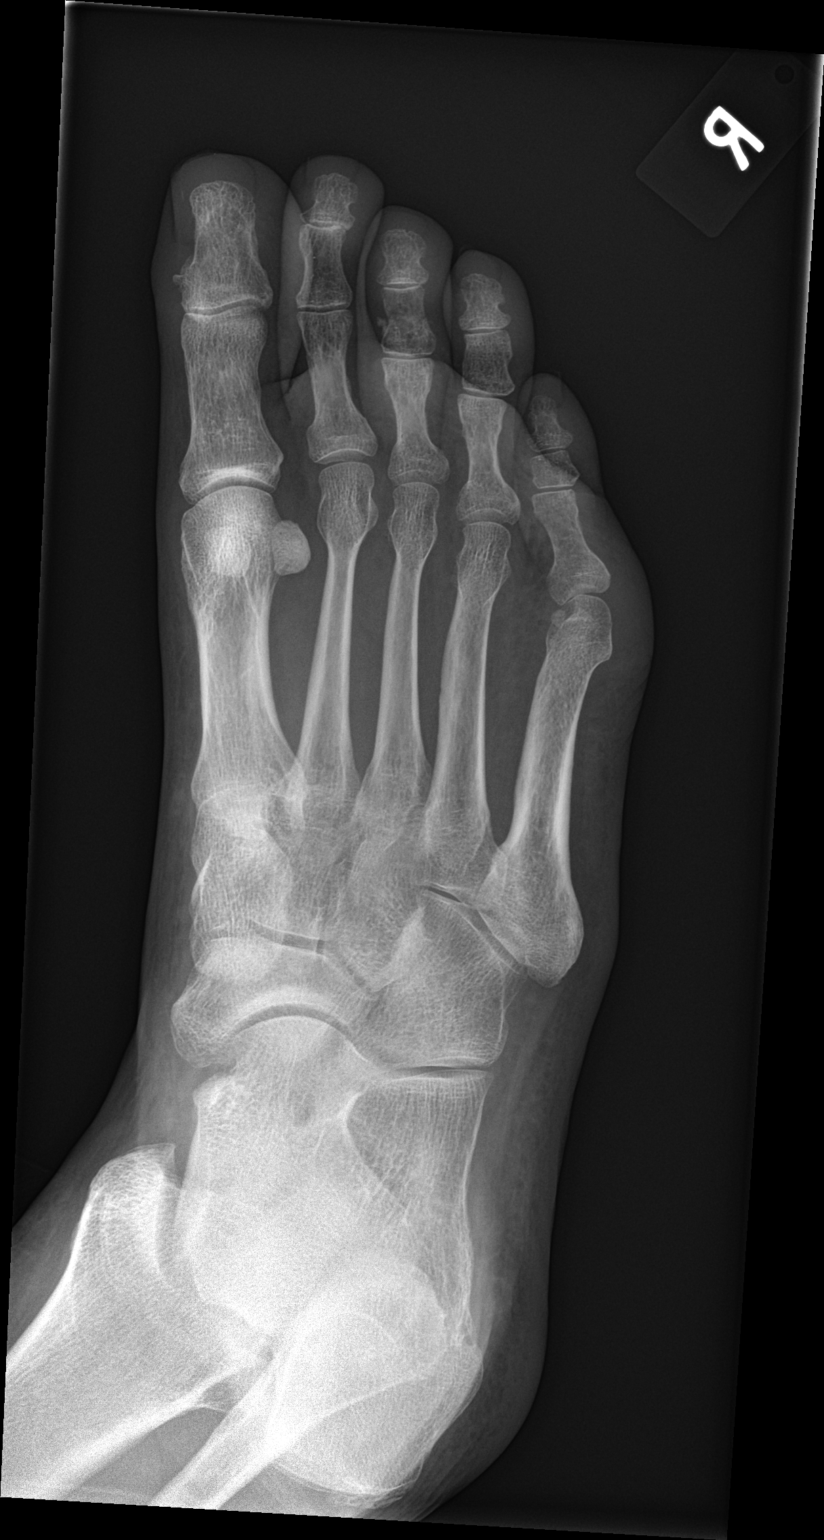

[foot lat]
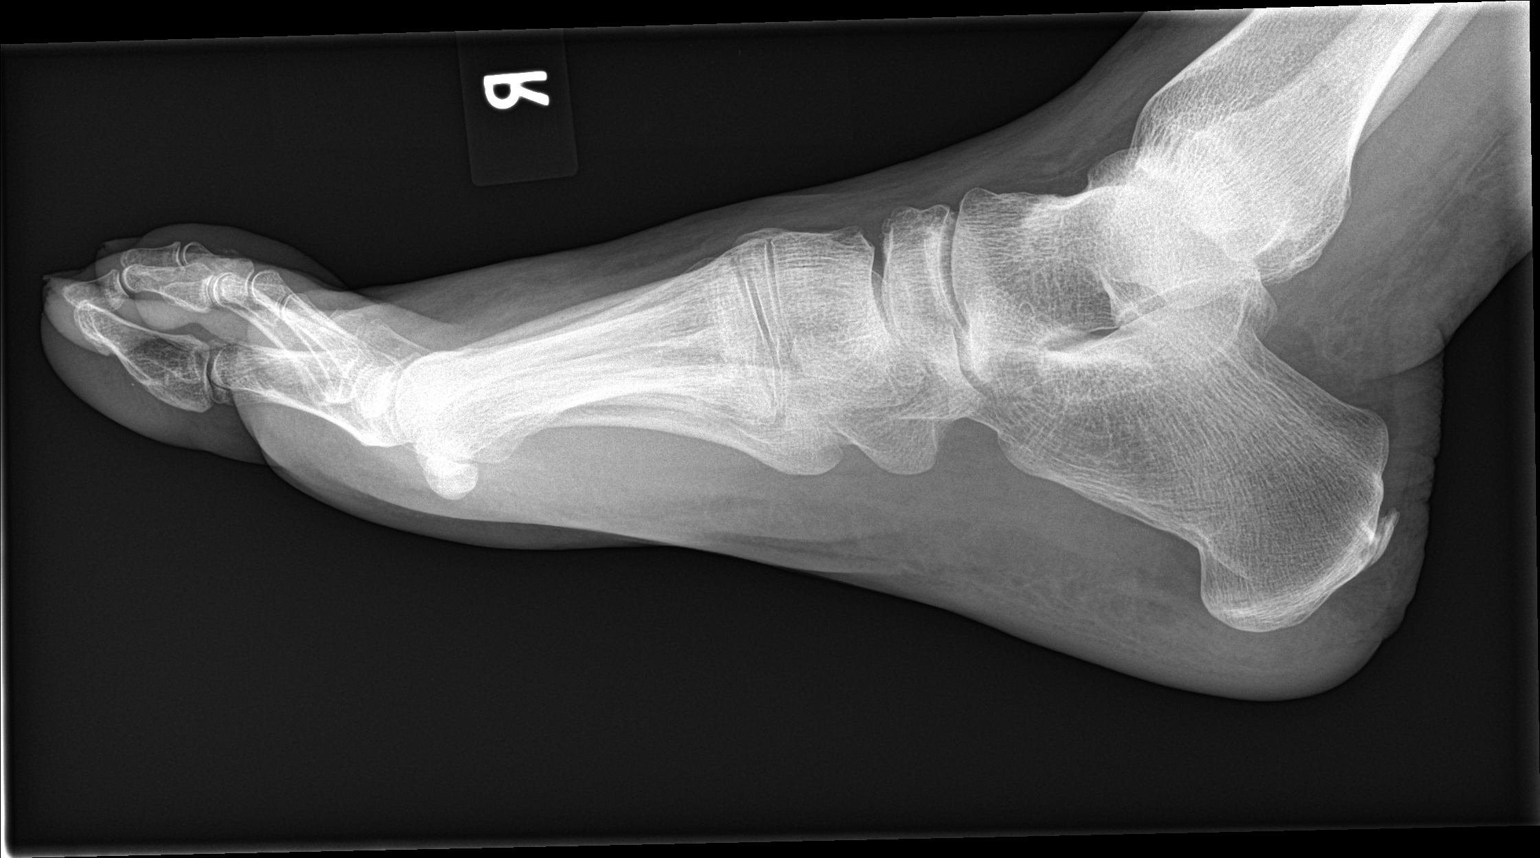

[3 of 3 positions shown; findings below may reference images not displayed]

FINDINGS: There is a an undisplaced fracture of the third middle phalanx with
some surrounding callus formation identified. No angulation is seen.
No other fractures are noted. No soft tissue changes are noted.
IMPRESSION: Third middle phalangeal fracture with callus formation. No other
focal abnormality is seen.

## 2018-04-09 DIAGNOSIS — E669 Obesity, unspecified: Secondary | ICD-10-CM | POA: Diagnosis not present

## 2018-04-09 DIAGNOSIS — Z008 Encounter for other general examination: Secondary | ICD-10-CM | POA: Diagnosis not present

## 2018-04-09 DIAGNOSIS — Z6835 Body mass index (BMI) 35.0-35.9, adult: Secondary | ICD-10-CM | POA: Diagnosis not present

## 2018-05-01 ENCOUNTER — Encounter: Payer: Self-pay | Admitting: Family Medicine

## 2018-05-01 ENCOUNTER — Ambulatory Visit: Payer: BLUE CROSS/BLUE SHIELD | Admitting: Family Medicine

## 2018-05-01 VITALS — BP 93/54 | HR 76 | Temp 98.5°F | Ht 71.0 in | Wt 253.0 lb

## 2018-05-01 DIAGNOSIS — G4730 Sleep apnea, unspecified: Secondary | ICD-10-CM

## 2018-05-01 DIAGNOSIS — E559 Vitamin D deficiency, unspecified: Secondary | ICD-10-CM | POA: Diagnosis not present

## 2018-05-01 DIAGNOSIS — R5383 Other fatigue: Secondary | ICD-10-CM

## 2018-05-01 DIAGNOSIS — E7800 Pure hypercholesterolemia, unspecified: Secondary | ICD-10-CM

## 2018-05-01 DIAGNOSIS — I10 Essential (primary) hypertension: Secondary | ICD-10-CM | POA: Diagnosis not present

## 2018-05-01 DIAGNOSIS — N400 Enlarged prostate without lower urinary tract symptoms: Secondary | ICD-10-CM

## 2018-05-01 DIAGNOSIS — Z1211 Encounter for screening for malignant neoplasm of colon: Secondary | ICD-10-CM

## 2018-05-01 DIAGNOSIS — E118 Type 2 diabetes mellitus with unspecified complications: Secondary | ICD-10-CM

## 2018-05-01 DIAGNOSIS — E109 Type 1 diabetes mellitus without complications: Secondary | ICD-10-CM

## 2018-05-01 DIAGNOSIS — Z0000 Encounter for general adult medical examination without abnormal findings: Secondary | ICD-10-CM | POA: Diagnosis not present

## 2018-05-01 LAB — URINALYSIS, COMPLETE
BILIRUBIN UA: NEGATIVE
LEUKOCYTES UA: NEGATIVE
Nitrite, UA: NEGATIVE
PH UA: 6.5 (ref 5.0–7.5)
PROTEIN UA: NEGATIVE
RBC, UA: NEGATIVE
Specific Gravity, UA: 1.02 (ref 1.005–1.030)
Urobilinogen, Ur: 4 mg/dL — ABNORMAL HIGH (ref 0.2–1.0)

## 2018-05-01 LAB — MICROSCOPIC EXAMINATION
BACTERIA UA: NONE SEEN
RBC, UA: NONE SEEN /hpf (ref 0–2)
RENAL EPITHEL UA: NONE SEEN /HPF
WBC, UA: NONE SEEN /hpf (ref 0–5)

## 2018-05-01 LAB — BAYER DCA HB A1C WAIVED: HB A1C (BAYER DCA - WAIVED): 7 % — ABNORMAL HIGH (ref <7.0)

## 2018-05-01 MED ORDER — ATORVASTATIN CALCIUM 80 MG PO TABS: 80.0000 mg | ORAL_TABLET | Freq: Every day | ORAL | 3 refills | Status: DC

## 2018-05-01 MED ORDER — RAMIPRIL 5 MG PO CAPS: 5.0000 mg | ORAL_CAPSULE | Freq: Every day | ORAL | 3 refills | Status: DC

## 2018-05-01 MED ORDER — OMEGA-3-ACID ETHYL ESTERS 1 G PO CAPS: 2.0000 g | ORAL_CAPSULE | Freq: Two times a day (BID) | ORAL | 3 refills | Status: DC

## 2018-05-01 MED ORDER — LEVOTHYROXINE SODIUM 175 MCG PO TABS: ORAL_TABLET | ORAL | 3 refills | Status: DC

## 2018-05-01 MED ORDER — INSULIN NPH (HUMAN) (ISOPHANE) 100 UNIT/ML ~~LOC~~ SUSP: SUBCUTANEOUS | 3 refills | Status: DC

## 2018-05-01 NOTE — Progress Notes (Signed)
Subjective:    Patient ID: Joseph Kirby, male    DOB: 02-15-1956, 62 y.o.   MRN: [id number]  HPI Pt here for annual CPE and management of chronic medical problems which includes diabetes. He is taking medication regularly.  The patient comes in today for his regular physical exam.  He is due to get a rectal exam PSA will be given an FOBT to return and get lab work today.  We will also get a urinalysis.  He complains of soreness in the left hand and thumb.  He is requesting refills on all of his medicines.  He is also due to get his colonoscopy.  The patient today says he is having no specific problems with chest pain shortness of breath his gastrointestinal tract including nausea vomiting diarrhea blood in the stool or black tarry bowel movements or change in bowel habits and with passing his water which is fine.  He denies any heartburn or indigestion.  He is in agreement to go see the gastroenterologist and get his colonoscopy which is past due.  He says that his blood sugars at home have been running generally fasting in the morning between 80 and 100 but there is some outliers and may go up to 200.  He is up-to-date on his eye exams and had his last one early this year.  He is still not made arrangements to see the endocrinologist and says he will do this.  He has had occasional to rare episodes of hypoglycemia and checks his blood sugars more regularly and is usually aware when he is having this problem.    Patient Active Problem List   Diagnosis Date Noted  . Diabetes, insulin-dependent 06/20/2012  . CAD (coronary artery disease)   . Hypertension   . Primary hypothyroidism   . Ejection fraction   . DKA (diabetic ketoacidoses), history of 03/17/2012  . BPH (benign prostatic hyperplasia)   . Colon polyp   . DYSLIPIDEMIA 03/27/2010   Outpatient Encounter Medications as of 05/01/2018  Medication Sig  . aspirin 81 MG tablet Take 81 mg by mouth daily.    Marland Kitchen atorvastatin (LIPITOR) 80 MG tablet  TAKE 1 TABLET BY MOUTH  DAILY  . Cholecalciferol (VITAMIN D) 2000 UNITS CAPS Take 2 capsules by mouth daily.  . Continuous Blood Gluc Receiver (FREESTYLE LIBRE READER) DEVI 1 each by Does not apply route daily.  . Continuous Blood Gluc Sensor (FREESTYLE LIBRE SENSOR SYSTEM) MISC APPLY 1 SENSOR SUBCUTANEOUSLY EVERY 10 DAYS  . insulin NPH Human (HUMULIN N) 100 UNIT/ML injection INJECT SUBCUTANEOUSLY 56  UNITS IN THE MORNING AND 46 UNITS IN THE EVENING AS  DIRECTED  . insulin regular (HUMULIN R) 100 units/mL injection INJECT 20 UNITS INTO THE  SKIN 2 TIMES DAILY BEFORE A MEAL.  Marland Kitchen Insulin Syringe-Needle U-100 (B-D INS SYR ULTRAFINE 1CC/30G) 30G X 1/2" 1 ML MISC USE AS DIRECTED 3 TIMES PER DAY  . levothyroxine (SYNTHROID, LEVOTHROID) 175 MCG tablet TAKE 1 TABLET BY MOUTH  DAILY , AND NONE ON SUNDAY  . niacin (NIASPAN) 500 MG CR tablet TAKE 3 TABLETS BY MOUTH AT  BEDTIME  . nystatin-triamcinolone (MYCOLOG II) cream Apply 1 application 2 (two) times daily topically.  Marland Kitchen omega-3 acid ethyl esters (LOVAZA) 1 g capsule Take 2 capsules (2 g total) by mouth 2 (two) times daily.  . ONE TOUCH ULTRA TEST test strip TEST TWO TIMES DAILY  . ramipril (ALTACE) 5 MG capsule TAKE 1 CAPSULE BY MOUTH  DAILY  . ranitidine (  ZANTAC) 150 MG tablet Take 1 tablet by mouth two  times daily  . sildenafil (REVATIO) 20 MG tablet Take 2-5 tabs PRN prior to Sexual activity.   No facility-administered encounter medications on file as of 05/01/2018.       Review of Systems  Constitutional: Negative.   HENT: Negative.   Eyes: Negative.   Respiratory: Negative.   Cardiovascular: Negative.   Gastrointestinal: Negative.   Endocrine: Negative.   Genitourinary: Negative.   Musculoskeletal: Positive for arthralgias (left hand pain ).  Skin: Negative.   Allergic/Immunologic: Negative.   Neurological: Negative.   Hematological: Negative.   Psychiatric/Behavioral: Negative.        Objective:   Physical Exam  Constitutional:  He is oriented to person, place, and time. He appears well-developed and well-nourished. No distress.  The patient is calm pleasant and relaxed as usual.  HENT:  Head: Normocephalic and atraumatic.  Right Ear: External ear normal.  Left Ear: External ear normal.  Nose: Nose normal.  Mouth/Throat: Oropharynx is clear and moist. No oropharyngeal exudate.  Eyes: Pupils are equal, round, and reactive to light. Conjunctivae and EOM are normal. Right eye exhibits no discharge. Left eye exhibits no discharge. No scleral icterus.  He is up-to-date on his eye exams.  Neck: Normal range of motion. Neck supple. No thyromegaly present.  No bruits thyromegaly or anterior cervical adenopathy  Cardiovascular: Normal rate, regular rhythm, normal heart sounds and intact distal pulses.  No murmur heard. Heart is regular at 72/min with good pedal pulses and no edema  Pulmonary/Chest: Effort normal and breath sounds normal. He has no wheezes. He has no rales. He exhibits no tenderness.  Clear anteriorly and posteriorly no axillary adenopathy or chest wall masses.  Abdominal: Soft. Bowel sounds are normal. He exhibits no mass. There is no tenderness.  Abdominal obesity without masses tenderness organ enlargement or bruits  Genitourinary: Rectum normal and penis normal.  Genitourinary Comments: The prostate is slightly enlarged and smooth.  There were no rectal masses.  External genitalia were within normal limits and no inguinal hernias were palpable and testicular areas were negative for masses.  Musculoskeletal: Normal range of motion. He exhibits no edema or deformity.  Lymphadenopathy:    He has no cervical adenopathy.  Neurological: He is alert and oriented to person, place, and time. He has normal reflexes. No cranial nerve deficit.  Skin: Skin is warm and dry. No rash noted.  Psychiatric: He has a normal mood and affect. His behavior is normal. Judgment and thought content normal.  The patient's mood  affect and behavior were normal for him.  Nursing note and vitals reviewed.   BP (!) 93/54 (BP Location: Left Arm)   Pulse 76   Temp 98.5 F (36.9 C) (Oral)   Ht _0  (1.803 m)   Wt 253 lb (114.8 kg)   BMI 35.29 kg/m        Assessment & Plan:  1. Annual physical exam -Patient was encouraged to follow through with getting his colonoscopy done and he says he will do this.  We will arrange the appointment for him. -He should continue to check his blood sugars regularly so as to avoid any episodes of hypoglycemia. - BMP8+EGFR - CBC with Differential/Platelet - Lipid panel - VITAMIN D 25 Hydroxy (Vit-D Deficiency, Fractures) - Hepatic function panel - PSA, total and free - Bayer DCA Hb A1c Waived - Thyroid Panel With TSH - Urinalysis, Complete  2. Pure hypercholesterolemia -Continue with current treatment pending  results of lab work - CBC with Differential/Platelet - Lipid panel  3. Essential hypertension -Blood pressure was good today and he will continue with current treatment - BMP8+EGFR - CBC with Differential/Platelet  4. Type 2 diabetes mellitus without complication with long-term use of insulin -The patient was reminded to call endocrinology and continue to follow-up with them on his diabetes which appears to be more brittle than most patients. - CBC with Differential/Platelet - Bayer DCA Hb A1c Waived  5. Vitamin D deficiency -Continue vitamin D replacement pending results of lab work - CBC with Differential/Platelet - VITAMIN D 25 Hydroxy (Vit-D Deficiency, Fractures)  6. Benign prostatic hyperplasia, unspecified whether lower urinary tract symptoms present -The prostate is enlarged but soft and smooth without any lumps or masses and he is asymptomatic as far as any urinary tract symptoms. - CBC with Differential/Platelet - PSA, total and free - Urinalysis, Complete  7. Other fatigue - PSA, total and free - Thyroid Panel With TSH - Urinalysis,  Complete  8. Screen for colon cancer - Ambulatory referral to Gastroenterology  9. Brittle diabetes ([place]) -Check A1c and follow-up with endocrinology  10. Morbid obesity ([place]) -Make all efforts to lose weight through diet and exercise  11. Sleep apnea, unspecified type -Follow-up with pulmonology as planned  Meds ordered this encounter  Medications  . atorvastatin (LIPITOR) 80 MG tablet    Sig: Take 1 tablet (80 mg total) by mouth daily.    Dispense:  90 tablet    Refill:  3  . insulin NPH Human (HUMULIN N) 100 UNIT/ML injection    Sig: INJECT SUBCUTANEOUSLY 56  UNITS IN THE MORNING AND 46 UNITS IN THE EVENING AS  DIRECTED    Dispense:  100 mL    Refill:  3  . omega-3 acid ethyl esters (LOVAZA) 1 g capsule    Sig: Take 2 capsules (2 g total) by mouth 2 (two) times daily.    Dispense:  360 capsule    Refill:  3  . ramipril (ALTACE) 5 MG capsule    Sig: Take 1 capsule (5 mg total) by mouth daily.    Dispense:  90 capsule    Refill:  3  . levothyroxine (SYNTHROID, LEVOTHROID) 175 MCG tablet    Sig: TAKE 1 TABLET BY MOUTH  DAILY , AND NONE ON SUNDAY    Dispense:  78 tablet    Refill:  3   Patient Instructions  Continue current medications. Continue good therapeutic lifestyle changes which include good diet and exercise. Fall precautions discussed with patient. If an FOBT was given today- please return it to our front desk. If you are over 59 years old - you may need Prevnar 80 or the adult Pneumonia vaccine.  **Flu shots are available--- please call and schedule a FLU-CLINIC appointment**  After your visit with [place] today you will receive a survey in the mail or online from [employer] regarding your care with [place]. Please take a moment to fill this out. Your feedback is very important to [place] as you can help [place] better understand your patient needs as well as improve your experience and satisfaction. WE CARE ABOUT YOU!!!   Check blood sugars regularly Please get appointment  with the endocrinology group that was following you in the past so that you can continue to have the best blood sugar control possible without having hypoglycemia Drink plenty of fluids and stay well-hydrated We will schedule you for a visit to see Dr. Laural Golden for a  colonoscopy and be sure and tell him about your brother that just died with stomach cancer Pick up a wrist brace and wear this for a couple of weeks at work and use warm wet compresses on the area of your thumb and hand that hurt when you come home from work and before you go to work. Take Tylenol for pain.  Arrie Senate MD

## 2018-05-01 NOTE — Patient Instructions (Addendum)
Continue current medications. Continue good therapeutic lifestyle changes which include good diet and exercise. Fall precautions discussed with patient. If an FOBT was given today- please return it to our front desk. If you are over 433 years old - you may need Prevnar 13 or the adult Pneumonia vaccine.  **Flu shots are available--- please call and schedule a FLU-CLINIC appointment**  After your visit with us today you will receive a survey in the mail or online from [employer] regarding your care with us. Please take a moment to fill this out. Your feedback is very important to us as you can help us better understand your patient needs as well as improve your experience and satisfaction. WE CARE ABOUT YOU!!!   Check blood sugars regularly Please get appointment with the endocrinology group that was following you in the past so that you can continue to have the best blood sugar control possible without having hypoglycemia Drink plenty of fluids and stay well-hydrated We will schedule you for a visit to see Dr. Karilyn Cotaehman for a colonoscopy and be sure and tell him about your brother that just died with stomach cancer Pick up a wrist brace and wear this for a couple of weeks at work and use warm wet compresses on the area of your thumb and hand that hurt when you come home from work and before you go to work. Take Tylenol for pain.

## 2018-05-02 ENCOUNTER — Encounter (INDEPENDENT_AMBULATORY_CARE_PROVIDER_SITE_OTHER): Payer: Self-pay | Admitting: *Deleted

## 2018-05-02 ENCOUNTER — Other Ambulatory Visit: Payer: Self-pay | Admitting: *Deleted

## 2018-05-02 DIAGNOSIS — R7989 Other specified abnormal findings of blood chemistry: Secondary | ICD-10-CM

## 2018-05-02 DIAGNOSIS — R945 Abnormal results of liver function studies: Principal | ICD-10-CM

## 2018-05-02 LAB — CBC WITH DIFFERENTIAL/PLATELET
BASOS: 1 %
Basophils Absolute: 0 10*3/uL (ref 0.0–0.2)
EOS (ABSOLUTE): 0.2 10*3/uL (ref 0.0–0.4)
EOS: 3 %
HEMATOCRIT: 44 % (ref 37.5–51.0)
Hemoglobin: 14.6 g/dL (ref 13.0–17.7)
Immature Grans (Abs): 0 10*3/uL (ref 0.0–0.1)
Immature Granulocytes: 0 %
LYMPHS ABS: 1.4 10*3/uL (ref 0.7–3.1)
Lymphs: 24 %
MCH: 28.2 pg (ref 26.6–33.0)
MCHC: 33.2 g/dL (ref 31.5–35.7)
MCV: 85 fL (ref 79–97)
MONOS ABS: 0.6 10*3/uL (ref 0.1–0.9)
Monocytes: 10 %
NEUTROS ABS: 3.7 10*3/uL (ref 1.4–7.0)
Neutrophils: 62 %
Platelets: 289 10*3/uL (ref 150–450)
RBC: 5.18 x10E6/uL (ref 4.14–5.80)
RDW: 12.9 % (ref 12.3–15.4)
WBC: 6 10*3/uL (ref 3.4–10.8)

## 2018-05-02 LAB — LIPID PANEL
CHOL/HDL RATIO: 3.1 ratio (ref 0.0–5.0)
Cholesterol, Total: 131 mg/dL (ref 100–199)
HDL: 42 mg/dL (ref >39)
LDL Calculated: 72 mg/dL (ref 0–99)
Triglycerides: 84 mg/dL (ref 0–149)
VLDL Cholesterol Cal: 17 mg/dL (ref 5–40)

## 2018-05-02 LAB — BMP8+EGFR
BUN / CREAT RATIO: 15 (ref 10–24)
BUN: 14 mg/dL (ref 8–27)
CO2: 25 mmol/L (ref 20–29)
CREATININE: 0.91 mg/dL (ref 0.76–1.27)
Calcium: 9.9 mg/dL (ref 8.6–10.2)
Chloride: 104 mmol/L (ref 96–106)
GFR, EST AFRICAN AMERICAN: 105 mL/min/{1.73_m2} (ref >59)
GFR, EST NON AFRICAN AMERICAN: 91 mL/min/{1.73_m2} (ref >59)
GLUCOSE: 123 mg/dL — AB (ref 65–99)
Potassium: 4.1 mmol/L (ref 3.5–5.2)
SODIUM: 142 mmol/L (ref 134–144)

## 2018-05-02 LAB — THYROID PANEL WITH TSH
Free Thyroxine Index: 2.2 (ref 1.2–4.9)
T3 Uptake Ratio: 32 % (ref 24–39)
T4 TOTAL: 6.8 ug/dL (ref 4.5–12.0)
TSH: 0.549 u[IU]/mL (ref 0.450–4.500)

## 2018-05-02 LAB — PSA, TOTAL AND FREE
PSA FREE PCT: 23.3 %
PSA FREE: 0.14 ng/mL
Prostate Specific Ag, Serum: 0.6 ng/mL (ref 0.0–4.0)

## 2018-05-02 LAB — HEPATIC FUNCTION PANEL
ALT: 35 IU/L (ref 0–44)
AST: 33 IU/L (ref 0–40)
Albumin: 4 g/dL (ref 3.6–4.8)
Alkaline Phosphatase: 121 IU/L — ABNORMAL HIGH (ref 39–117)
BILIRUBIN TOTAL: 0.5 mg/dL (ref 0.0–1.2)
Bilirubin, Direct: 0.16 mg/dL (ref 0.00–0.40)
Total Protein: 6.5 g/dL (ref 6.0–8.5)

## 2018-05-02 LAB — VITAMIN D 25 HYDROXY (VIT D DEFICIENCY, FRACTURES): Vit D, 25-Hydroxy: 47.3 ng/mL (ref 30.0–100.0)

## 2018-06-09 ENCOUNTER — Other Ambulatory Visit: Payer: Self-pay | Admitting: Family Medicine

## 2018-06-09 ENCOUNTER — Other Ambulatory Visit: Payer: BLUE CROSS/BLUE SHIELD

## 2018-06-09 DIAGNOSIS — R945 Abnormal results of liver function studies: Secondary | ICD-10-CM | POA: Diagnosis not present

## 2018-06-09 DIAGNOSIS — R7989 Other specified abnormal findings of blood chemistry: Secondary | ICD-10-CM

## 2018-06-10 LAB — HEPATIC FUNCTION PANEL
ALT: 39 IU/L (ref 0–44)
AST: 30 IU/L (ref 0–40)
Albumin: 3.8 g/dL (ref 3.6–4.8)
Alkaline Phosphatase: 123 IU/L — ABNORMAL HIGH (ref 39–117)
Bilirubin Total: 0.3 mg/dL (ref 0.0–1.2)
Bilirubin, Direct: 0.14 mg/dL (ref 0.00–0.40)
TOTAL PROTEIN: 6.1 g/dL (ref 6.0–8.5)

## 2018-06-11 ENCOUNTER — Other Ambulatory Visit: Payer: Self-pay | Admitting: *Deleted

## 2018-06-11 DIAGNOSIS — R945 Abnormal results of liver function studies: Principal | ICD-10-CM

## 2018-06-11 DIAGNOSIS — R7989 Other specified abnormal findings of blood chemistry: Secondary | ICD-10-CM

## 2018-06-18 DIAGNOSIS — Z719 Counseling, unspecified: Secondary | ICD-10-CM | POA: Diagnosis not present

## 2018-06-18 DIAGNOSIS — E669 Obesity, unspecified: Secondary | ICD-10-CM | POA: Diagnosis not present

## 2018-06-18 DIAGNOSIS — E119 Type 2 diabetes mellitus without complications: Secondary | ICD-10-CM | POA: Diagnosis not present

## 2018-06-18 DIAGNOSIS — Z008 Encounter for other general examination: Secondary | ICD-10-CM | POA: Diagnosis not present

## 2018-07-09 ENCOUNTER — Other Ambulatory Visit: Payer: BLUE CROSS/BLUE SHIELD

## 2018-07-09 DIAGNOSIS — R945 Abnormal results of liver function studies: Secondary | ICD-10-CM | POA: Diagnosis not present

## 2018-07-09 DIAGNOSIS — R7989 Other specified abnormal findings of blood chemistry: Secondary | ICD-10-CM

## 2018-07-10 LAB — HEPATIC FUNCTION PANEL
ALBUMIN: 3.8 g/dL (ref 3.6–4.8)
ALT: 26 IU/L (ref 0–44)
AST: 27 IU/L (ref 0–40)
Alkaline Phosphatase: 116 IU/L (ref 39–117)
Bilirubin Total: 0.4 mg/dL (ref 0.0–1.2)
Bilirubin, Direct: 0.17 mg/dL (ref 0.00–0.40)
TOTAL PROTEIN: 5.8 g/dL — AB (ref 6.0–8.5)

## 2018-07-30 ENCOUNTER — Ambulatory Visit (INDEPENDENT_AMBULATORY_CARE_PROVIDER_SITE_OTHER): Payer: BLUE CROSS/BLUE SHIELD

## 2018-07-30 DIAGNOSIS — Z23 Encounter for immunization: Secondary | ICD-10-CM

## 2018-08-18 ENCOUNTER — Other Ambulatory Visit: Payer: Self-pay | Admitting: Family Medicine

## 2018-09-08 ENCOUNTER — Ambulatory Visit: Payer: BLUE CROSS/BLUE SHIELD | Admitting: Family Medicine

## 2018-09-08 ENCOUNTER — Encounter: Payer: Self-pay | Admitting: Family Medicine

## 2018-09-08 VITALS — BP 97/48 | HR 63 | Temp 97.8°F | Ht 71.0 in | Wt 253.0 lb

## 2018-09-08 DIAGNOSIS — E7800 Pure hypercholesterolemia, unspecified: Secondary | ICD-10-CM | POA: Diagnosis not present

## 2018-09-08 DIAGNOSIS — E785 Hyperlipidemia, unspecified: Secondary | ICD-10-CM

## 2018-09-08 DIAGNOSIS — E118 Type 2 diabetes mellitus with unspecified complications: Secondary | ICD-10-CM

## 2018-09-08 DIAGNOSIS — I10 Essential (primary) hypertension: Secondary | ICD-10-CM

## 2018-09-08 DIAGNOSIS — E1169 Type 2 diabetes mellitus with other specified complication: Secondary | ICD-10-CM

## 2018-09-08 DIAGNOSIS — Z800 Family history of malignant neoplasm of digestive organs: Secondary | ICD-10-CM

## 2018-09-08 DIAGNOSIS — E559 Vitamin D deficiency, unspecified: Secondary | ICD-10-CM | POA: Diagnosis not present

## 2018-09-08 DIAGNOSIS — N400 Enlarged prostate without lower urinary tract symptoms: Secondary | ICD-10-CM

## 2018-09-08 MED ORDER — ICOSAPENT ETHYL 1 G PO CAPS: 2.0000 | ORAL_CAPSULE | Freq: Two times a day (BID) | ORAL | 0 refills | Status: DC

## 2018-09-08 NOTE — Patient Instructions (Addendum)
Medicare Annual Wellness Visit  [place] and the medical providers at [facility] strive to bring you the best medical care.  In doing so we not only want to address your current medical conditions and concerns but also to detect new conditions early and prevent illness, disease and health-related problems.    Medicare offers a yearly Wellness Visit which allows our clinical staff to assess your need for preventative services including immunizations, lifestyle education, counseling to decrease risk of preventable diseases and screening for fall risk and other medical concerns.    This visit is provided free of charge (no copay) for all Medicare recipients. The clinical pharmacists at [facility] have begun to conduct these Wellness Visits which will also include a thorough review of all your medications.    As you primary medical provider recommend that you make an appointment for your Annual Wellness Visit if you have not done so already this year.  You may set up this appointment before you leave today or you may call back [phone] and schedule an appointment.  Please make sure when you call that you mention that you are scheduling your Annual Wellness Visit with the clinical pharmacist so that the appointment may be made for the proper length of time.     Continue current medications. Continue good therapeutic lifestyle changes which include good diet and exercise. Fall precautions discussed with patient. If an FOBT was given today- please return it to our front desk. If you are over 658 years old - you may need Prevnar 13 or the adult Pneumonia vaccine.  **Flu shots are available--- please call and schedule a FLU-CLINIC appointment**  After your visit with us today you will receive a survey in the mail or online from [employer] regarding your care with us. Please take a moment to fill this out. Your feedback is very  important to us as you can help us better understand your patient needs as well as improve your experience and satisfaction. WE CARE ABOUT YOU!!!   We will try to get your appointment again with a gastroenterologist since your brother had stomach cancer and died with this.  You are now about 7 months past due. The patient will get an eye exam as this should be done yearly because of his diabetes We will also try to get the patient on Vascepa 2 g twice daily because of the significant event reduction this medication has in diabetics as far as strokes and heart disease.  This was all explained to the patient. We will also schedule a revisit with the cardiologist because of the multiple risk factors this patient has for diabetes if it has been over 2 or 3 years since the last visit.

## 2018-09-08 NOTE — Progress Notes (Signed)
Subjective:    Patient ID: Joseph Kirby, male    DOB: [birth year], 62 y.o.   MRN: [id number]  HPI Pt here for follow up and management of chronic medical problems which includes diabetes and hypertension. He is taking medication regularly.  The patient is doing well overall with good vital signs and because of multiple medical conditions still has morbid obesity based upon a BMI of 35.3.  He will return to the office fasting for his lab work.  He was given an FOBT to return today.  He has no complaints and does not need any refills.  The record has that he is still on ranitidine and will remind him to change to [facility].  We also should switch him to vascepa from omega-3 fatty acids.  He is currently taking Lovaza.  He is also on thyroid replacement and atorvastatin and vitamin D 2000 units daily.  The patient is pleasant and laid back as usual.  He is past due on getting his colonoscopy and says that the people he called to get the appointment with never called him back so we will really work on that.  He does tell me that a brother had "stomach cancer".  Not sure where the cancer was located.  The patient today denies any chest pain pressure tightness or shortness of breath.  He denies any trouble with his stomach including heartburn indigestion nausea vomiting diarrhea blood in the stool or black tarry bowel movements.  He says he is passing his water without problems.  We will try to reschedule him with the gastroenterologist and regional to get a colonoscopy done.    Patient Active Problem List   Diagnosis Date Noted  . Diabetes, insulin-dependent 06/20/2012  . CAD (coronary artery disease)   . Hypertension   . Primary hypothyroidism   . Ejection fraction   . DKA (diabetic ketoacidoses), history of 03/17/2012  . BPH (benign prostatic hyperplasia)   . Colon polyp   . DYSLIPIDEMIA 03/27/2010   Outpatient Encounter Medications as of 09/08/2018  Medication Sig  . aspirin 81 MG tablet Take 81  mg by mouth daily.    Marland Kitchen atorvastatin (LIPITOR) 80 MG tablet Take 1 tablet (80 mg total) by mouth daily.  . Cholecalciferol (VITAMIN D) 2000 UNITS CAPS Take 2 capsules by mouth daily.  . Continuous Blood Gluc Receiver (FREESTYLE LIBRE READER) DEVI 1 each by Does not apply route daily.  . Continuous Blood Gluc Sensor (FREESTYLE LIBRE SENSOR SYSTEM) MISC APPLY 1 SENSOR SUBCUTANEOUSLY EVERY 10 DAYS  . insulin NPH Human (HUMULIN N) 100 UNIT/ML injection INJECT SUBCUTANEOUSLY 56  UNITS IN THE MORNING AND 46 UNITS IN THE EVENING AS  DIRECTED  . insulin regular (HUMULIN R) 100 units/mL injection INJECT SUBCUTANEOUSLY 20  UNITS TWO TIMES DAILY  BEFORE A MEAL  . Insulin Syringe-Needle U-100 (B-D INS SYR ULTRAFINE 1CC/30G) 30G X 1/2" 1 ML MISC USE AS DIRECTED 3 TIMES PER DAY  . levothyroxine (SYNTHROID, LEVOTHROID) 175 MCG tablet TAKE 1 TABLET BY MOUTH  DAILY , AND NONE ON SUNDAY  . niacin (NIASPAN) 500 MG CR tablet TAKE 3 TABLETS BY MOUTH AT  BEDTIME  . nystatin-triamcinolone (MYCOLOG II) cream Apply 1 application 2 (two) times daily topically.  Marland Kitchen omega-3 acid ethyl esters (LOVAZA) 1 g capsule Take 2 capsules (2 g total) by mouth 2 (two) times daily.  . ONE TOUCH ULTRA TEST test strip TEST TWO TIMES DAILY  . ramipril (ALTACE) 5 MG capsule Take 1 capsule (5  mg total) by mouth daily.  . ranitidine (ZANTAC) 150 MG tablet Take 1 tablet by mouth two  times daily  . sildenafil (REVATIO) 20 MG tablet Take 2-5 tabs PRN prior to Sexual activity.   No facility-administered encounter medications on file as of 09/08/2018.      Review of Systems  Constitutional: Negative.   HENT: Negative.   Eyes: Negative.   Respiratory: Negative.   Cardiovascular: Negative.   Gastrointestinal: Negative.   Endocrine: Negative.   Genitourinary: Negative.   Musculoskeletal: Negative.   Skin: Negative.   Allergic/Immunologic: Negative.   Neurological: Negative.   Hematological: Negative.   Psychiatric/Behavioral: Negative.         Objective:   Physical Exam Vitals signs and nursing note reviewed.  Constitutional:      Appearance: Normal appearance. He is well-developed. He is obese.     Comments: As mentioned earlier, the patient is laid back and pleasant and never has any specific complaints.  He denies any episodes of hypoglycemia.  HENT:     Head: Normocephalic and atraumatic.     Right Ear: Tympanic membrane, ear canal and external ear normal. There is no impacted cerumen.     Left Ear: Tympanic membrane, ear canal and external ear normal. There is no impacted cerumen.     Nose: Nose normal. No congestion.     Mouth/Throat:     Mouth: Mucous membranes are moist.     Pharynx: Oropharynx is clear. No oropharyngeal exudate.  Eyes:     General: No scleral icterus.       Right eye: No discharge.        Left eye: No discharge.     Extraocular Movements: Extraocular movements intact.     Conjunctiva/sclera: Conjunctivae normal.     Pupils: Pupils are equal, round, and reactive to light.     Comments: Patient is needing an eye exam soon and he will call and arrange this.  Neck:     Musculoskeletal: Normal range of motion and neck supple.     Thyroid: No thyromegaly.     Vascular: No carotid bruit.     Trachea: No tracheal deviation.  Cardiovascular:     Rate and Rhythm: Normal rate and regular rhythm.     Pulses: Normal pulses.     Heart sounds: Normal heart sounds. No murmur.     Comments: Regular at 72/min with good pedal pulses and no edema Pulmonary:     Effort: Pulmonary effort is normal.     Breath sounds: Normal breath sounds. No wheezing or rales.  Chest:     Chest wall: No tenderness.  Abdominal:     General: Bowel sounds are normal.     Palpations: Abdomen is soft. There is no mass.     Tenderness: There is no abdominal tenderness. There is no guarding.     Comments: Abdominal obesity without masses organ enlargement bruits or inguinal adenopathy  Musculoskeletal: Normal range of  motion.        General: No tenderness.     Right lower leg: No edema.     Left lower leg: No edema.  Lymphadenopathy:     Cervical: No cervical adenopathy.  Skin:    General: Skin is warm and dry.     Findings: No lesion or rash.  Neurological:     General: No focal deficit present.     Mental Status: He is alert and oriented to person, place, and time.     Cranial  Nerves: No cranial nerve deficit.     Deep Tendon Reflexes: Reflexes are normal and symmetric.     Comments: Reflexes are 2+ and equal bilaterally and sensation is normal in both feet.  Psychiatric:        Mood and Affect: Mood normal.        Behavior: Behavior normal.        Thought Content: Thought content normal.        Judgment: Judgment normal.     Comments: Patient's mood affect and behavior are all normal for him.     BP (!) 97/48 (BP Location: Left Arm)   Pulse 63   Temp 97.8 F (36.6 C) (Oral)   Ht 5' 11"  (1.803 m)   Wt 253 lb (114.8 kg)   BMI 35.29 kg/m        Assessment & Plan:  1. Pure hypercholesterolemia -Continue with current treatment.  We will reassess any changes based on the hemoglobin A1c. - CBC with Differential/Platelet; Future - Lipid panel; Future  2. Essential hypertension -The blood pressure is good today and he will continue with current treatment - BMP8+EGFR; Future - CBC with Differential/Platelet; Future - Hepatic function panel; Future  3. Type 2 diabetes mellitus without complication with long-term use of insulin -Continue current treatment pending results of A1c - CBC with Differential/Platelet; Future - Bayer DCA Hb A1c Waived; Future  4. Vitamin D deficiency -Continue vitamin D replacement pending results of lab work that is yet to be done - CBC with Differential/Platelet; Future - VITAMIN D 25 Hydroxy (Vit-D Deficiency, Fractures); Future  5. Benign prostatic hyperplasia, unspecified whether lower urinary tract symptoms present -No complaints today with  voiding. - CBC with Differential/Platelet; Future  6.  Morbid obesity based on 2 or more comorbid conditions with a BMI greater than 35 -Make all efforts to lose weight through diet and exercise  7.  Positive family history for GI cancer -Arrange for patient to have colonoscopy  Patient Instructions                       Medicare Annual Wellness Visit  [facility] and the medical providers at [facility] strive to bring you the best medical care.  In doing so we not only want to address your current medical conditions and concerns but also to detect new conditions early and prevent illness, disease and health-related problems.    Medicare offers a yearly Wellness Visit which allows our clinical staff to assess your need for preventative services including immunizations, lifestyle education, counseling to decrease risk of preventable diseases and screening for fall risk and other medical concerns.    This visit is provided free of charge (no copay) for all Medicare recipients. The clinical pharmacists at [facility] have begun to conduct these Wellness Visits which will also include a thorough review of all your medications.    As you primary medical provider recommend that you make an appointment for your Annual Wellness Visit if you have not done so already this year.  You may set up this appointment before you leave today or you may call back ([phone]) and schedule an appointment.  Please make sure when you call that you mention that you are scheduling your Annual Wellness Visit with the clinical pharmacist so that the appointment may be made for the proper length of time.     Continue current medications. Continue good therapeutic lifestyle changes which include good diet  and exercise. Fall precautions discussed with patient. If an FOBT was given today- please return it to our front desk. If you are over 60 years old - you may need Prevnar 38  or the adult Pneumonia vaccine.  **Flu shots are available--- please call and schedule a FLU-CLINIC appointment**  After your visit with [place] today you will receive a survey in the mail or online from [employer] regarding your care with [place]. Please take a moment to fill this out. Your feedback is very important to [place] as you can help [place] better understand your patient needs as well as improve your experience and satisfaction. WE CARE ABOUT YOU!!!   We will try to get your appointment again with a gastroenterologist since your brother had stomach cancer and died with this.  You are now about 7 months past due. The patient will get an eye exam as this should be done yearly because of his diabetes We will also try to get the patient on Vascepa 2 g twice daily because of the significant event reduction this medication has in diabetics as far as strokes and heart disease.  This was all explained to the patient. We will also schedule a revisit with the cardiologist because of the multiple risk factors this patient has for diabetes if it has been over 2 or 3 years since the last visit.  Arrie Senate MD

## 2018-09-23 ENCOUNTER — Other Ambulatory Visit: Payer: BLUE CROSS/BLUE SHIELD

## 2018-09-23 DIAGNOSIS — E559 Vitamin D deficiency, unspecified: Secondary | ICD-10-CM

## 2018-09-23 DIAGNOSIS — E118 Type 2 diabetes mellitus with unspecified complications: Secondary | ICD-10-CM

## 2018-09-23 DIAGNOSIS — E7800 Pure hypercholesterolemia, unspecified: Secondary | ICD-10-CM

## 2018-09-23 DIAGNOSIS — N400 Enlarged prostate without lower urinary tract symptoms: Secondary | ICD-10-CM

## 2018-09-23 DIAGNOSIS — I10 Essential (primary) hypertension: Secondary | ICD-10-CM | POA: Diagnosis not present

## 2018-09-23 LAB — BAYER DCA HB A1C WAIVED: HB A1C (BAYER DCA - WAIVED): 6.9 % (ref <7.0)

## 2018-09-24 ENCOUNTER — Telehealth: Payer: Self-pay | Admitting: Family Medicine

## 2018-09-24 LAB — CBC WITH DIFFERENTIAL/PLATELET
Basophils Absolute: 0 10*3/uL (ref 0.0–0.2)
Basos: 0 %
EOS (ABSOLUTE): 0.3 10*3/uL (ref 0.0–0.4)
Eos: 6 %
Hematocrit: 40 % (ref 37.5–51.0)
Hemoglobin: 13.6 g/dL (ref 13.0–17.7)
Immature Grans (Abs): 0 10*3/uL (ref 0.0–0.1)
Immature Granulocytes: 0 %
Lymphocytes Absolute: 1.4 10*3/uL (ref 0.7–3.1)
Lymphs: 27 %
MCH: 28.8 pg (ref 26.6–33.0)
MCHC: 34 g/dL (ref 31.5–35.7)
MCV: 85 fL (ref 79–97)
Monocytes Absolute: 0.5 10*3/uL (ref 0.1–0.9)
Monocytes: 11 %
Neutrophils Absolute: 2.9 10*3/uL (ref 1.4–7.0)
Neutrophils: 56 %
PLATELETS: 228 10*3/uL (ref 150–450)
RBC: 4.73 x10E6/uL (ref 4.14–5.80)
RDW: 12.8 % (ref 11.6–15.4)
WBC: 5.2 10*3/uL (ref 3.4–10.8)

## 2018-09-24 LAB — BMP8+EGFR
BUN/Creatinine Ratio: 16 (ref 10–24)
BUN: 16 mg/dL (ref 8–27)
CHLORIDE: 104 mmol/L (ref 96–106)
CO2: 25 mmol/L (ref 20–29)
Calcium: 9.5 mg/dL (ref 8.6–10.2)
Creatinine, Ser: 1.02 mg/dL (ref 0.76–1.27)
GFR calc Af Amer: 91 mL/min/{1.73_m2} (ref >59)
GFR calc non Af Amer: 79 mL/min/{1.73_m2} (ref >59)
GLUCOSE: 140 mg/dL — AB (ref 65–99)
Potassium: 4.3 mmol/L (ref 3.5–5.2)
Sodium: 142 mmol/L (ref 134–144)

## 2018-09-24 LAB — HEPATIC FUNCTION PANEL
ALBUMIN: 3.7 g/dL (ref 3.6–4.8)
ALT: 31 IU/L (ref 0–44)
AST: 17 IU/L (ref 0–40)
Alkaline Phosphatase: 117 IU/L (ref 39–117)
Bilirubin Total: 0.5 mg/dL (ref 0.0–1.2)
Bilirubin, Direct: 0.17 mg/dL (ref 0.00–0.40)
Total Protein: 5.8 g/dL — ABNORMAL LOW (ref 6.0–8.5)

## 2018-09-24 LAB — LIPID PANEL
Chol/HDL Ratio: 2.9 ratio (ref 0.0–5.0)
Cholesterol, Total: 121 mg/dL (ref 100–199)
HDL: 42 mg/dL (ref >39)
LDL Calculated: 65 mg/dL (ref 0–99)
TRIGLYCERIDES: 72 mg/dL (ref 0–149)
VLDL Cholesterol Cal: 14 mg/dL (ref 5–40)

## 2018-09-24 LAB — VITAMIN D 25 HYDROXY (VIT D DEFICIENCY, FRACTURES): Vit D, 25-Hydroxy: 34.3 ng/mL (ref 30.0–100.0)

## 2018-09-24 NOTE — Telephone Encounter (Signed)
Pt called and VM left with all labs

## 2018-10-01 DIAGNOSIS — G4733 Obstructive sleep apnea (adult) (pediatric): Secondary | ICD-10-CM | POA: Diagnosis not present

## 2018-10-28 LAB — HM DIABETES EYE EXAM

## 2018-11-03 ENCOUNTER — Encounter: Payer: Self-pay | Admitting: Cardiology

## 2018-11-14 ENCOUNTER — Other Ambulatory Visit: Payer: Self-pay | Admitting: Family Medicine

## 2018-11-17 DIAGNOSIS — E669 Obesity, unspecified: Secondary | ICD-10-CM | POA: Diagnosis not present

## 2018-11-17 DIAGNOSIS — E119 Type 2 diabetes mellitus without complications: Secondary | ICD-10-CM | POA: Diagnosis not present

## 2018-11-17 DIAGNOSIS — E559 Vitamin D deficiency, unspecified: Secondary | ICD-10-CM | POA: Diagnosis not present

## 2018-11-17 DIAGNOSIS — Z719 Counseling, unspecified: Secondary | ICD-10-CM | POA: Diagnosis not present

## 2018-11-17 NOTE — Progress Notes (Signed)
Cardiology Office Note   Date:  11/19/2018   ID:  Aerik Bandura, DOB 04-14-1956, MRN [id number]  PCP:  Ernestina Penna, MD  Cardiologist:   No primary care provider on file. Referring:  Ernestina Penna, MD  Chief Complaint  Patient presents with  . Coronary Artery Disease      History of Present Illness: Joseph Kirby is a 63 y.o. male who presents for evaluation of coronary disease and multiple cardiovascular risk factors. He had a history in 2012 elevated cardiac catheterization and was found to have 30% disease and all of his major coronary distributions. He had a stress test in 2013 was negative for any evidence of ischemia on imaging.  He had a negative POET (Plain Old Exercise Treadmill) in Dec of 2016.   Since I last saw him he has done well.    He does activity such as walking 50 yards down to and out building and back up an incline. The patient denies any new symptoms such as chest discomfort, neck or arm discomfort. There has been no new shortness of breath, PND or orthopnea. There have been no reported palpitations, presyncope or syncope.     Past Medical History:  Diagnosis Date  . BPH (benign prostatic hyperplasia)   . CAD (coronary artery disease)    Nonobstructive disease by catheterization, 2003  . Colon polyp   . Diabetes mellitus   . Ejection fraction    EF 55%, catheterization, 2003  . Hyperlipidemia   . Hypothyroidism   . Sleep apnea     Past Surgical History:  Procedure Laterality Date  . CARDIAC CATHETERIZATION  2003   NONOBSTRUCTIVE, EF 55%  . NASAL SEPTUM SURGERY       Current Outpatient Medications  Medication Sig Dispense Refill  . aspirin 81 MG tablet Take 81 mg by mouth daily.      Marland Kitchen atorvastatin (LIPITOR) 80 MG tablet Take 1 tablet (80 mg total) by mouth daily. 90 tablet 3  . Cholecalciferol (VITAMIN D) 2000 UNITS CAPS Take 2 capsules by mouth daily.    . insulin NPH Human (HUMULIN N) 100 UNIT/ML injection INJECT SUBCUTANEOUSLY 56  UNITS  IN THE MORNING AND 46 UNITS IN THE EVENING AS  DIRECTED 100 mL 3  . insulin regular (HUMULIN R) 100 units/mL injection INJECT SUBCUTANEOUSLY 20  UNITS TWO TIMES DAILY  BEFORE A MEAL 40 mL 0  . levothyroxine (SYNTHROID, LEVOTHROID) 175 MCG tablet TAKE 1 TABLET BY MOUTH  DAILY , AND NONE ON SUNDAY 78 tablet 3  . niacin (NIASPAN) 500 MG CR tablet TAKE 3 TABLETS BY MOUTH AT  BEDTIME 270 tablet 1  . nystatin-triamcinolone (MYCOLOG II) cream Apply 1 application 2 (two) times daily topically. 30 g 1  . omega-3 acid ethyl esters (LOVAZA) 1 g capsule Take 2 capsules (2 g total) by mouth 2 (two) times daily. 360 capsule 3  . ramipril (ALTACE) 5 MG capsule Take 1 capsule (5 mg total) by mouth daily. 90 capsule 3  . sildenafil (REVATIO) 20 MG tablet Take 2-5 tabs PRN prior to Sexual activity. 50 tablet 2  . VASCEPA 1 g CAPS TAKE 2 CAPSULES BY MOUTH  TWO TIMES DAILY 360 capsule 0  . Continuous Blood Gluc Receiver (FREESTYLE LIBRE READER) DEVI 1 each by Does not apply route daily. 1 Device 0  . Continuous Blood Gluc Sensor (FREESTYLE LIBRE SENSOR SYSTEM) MISC APPLY 1 SENSOR SUBCUTANEOUSLY EVERY 10 DAYS 3 each 3  . Insulin Syringe-Needle U-100 (B-D  INS SYR ULTRAFINE 1CC/30G) 30G X 1/2" 1 ML MISC USE AS DIRECTED 3 TIMES PER DAY 300 each 2  . ONE TOUCH ULTRA TEST test strip TEST TWO TIMES DAILY 100 each 11   No current facility-administered medications for this visit.     Allergies:   Ezetimibe-simvastatin; Iodine; and Shrimp [shellfish allergy]     ROS:  Please see the history of present illness.   Otherwise, review of systems are positive for none.   All other systems are reviewed and negative.    PHYSICAL EXAM: VS:  BP 120/60   Pulse 64   Ht 5\' 11"  (1.803 m)   Wt 254 lb (115.2 kg)   BMI 35.43 kg/m  , BMI Body mass index is 35.43 kg/m. GENERAL:  Well appearing NECK:  No jugular venous distention, waveform within normal limits, carotid upstroke brisk and symmetric, no bruits, no thyromegaly LUNGS:   Clear to auscultation bilaterally CHEST:  Unremarkable HEART:  PMI not displaced or sustained,S1 and S2 within normal limits, no S3, no S4, no clicks, no rubs, no murmurs ABD:  Flat, positive bowel sounds normal in frequency in pitch, no bruits, no rebound, no guarding, no midline pulsatile mass, no hepatomegaly, no splenomegaly EXT:  2 plus pulses throughout, no edema, no cyanosis no clubbing   EKG:  EKG is ordered today. The ekg ordered today demonstrates normal sinus rhythm, rate 64, low voltage, poor anterior R wave progression, no acute ST-T wave changes.   Recent Labs: 05/01/2018: TSH 0.549 09/23/2018: ALT 31; BUN 16; Creatinine, Ser 1.02; Hemoglobin 13.6; Platelets 228; Potassium 4.3; Sodium 142    Lipid Panel    Component Value Date/Time   CHOL 121 09/23/2018 0858   CHOL 119 12/25/2012 0952   TRIG 72 09/23/2018 0858   TRIG 77 09/09/2014 1022   TRIG 39 12/25/2012 0952   HDL 42 09/23/2018 0858   HDL 45 09/09/2014 1022   HDL 42 12/25/2012 0952   CHOLHDL 2.9 09/23/2018 0858   LDLCALC 65 09/23/2018 0858   LDLCALC 81 05/18/2014 1025   LDLCALC 69 12/25/2012 0952      Wt Readings from Last 3 Encounters:  11/19/18 254 lb (115.2 kg)  09/08/18 253 lb (114.8 kg)  05/01/18 253 lb (114.8 kg)      Other studies Reviewed: Additional studies/ records that were reviewed today include: Labs. Review of the above records demonstrates:  Please see elsewhere in the note.     ASSESSMENT AND PLAN:  CAD:   The patient has no new sypmtoms.  No further cardiovascular testing is indicated.  We will continue with aggressive risk reduction and meds as listed.  HTN:  The blood pressure is at target. No change in medications is indicated. We will continue with therapeutic lifestyle changes (TLC).  DYSLIPIDEMIA:   LDL was 65.  No change in therapy.    DM:  Last A1C was 6.9.  Continue current meds.  OVERWEIGHT:  The patient understands the need to lose weight with diet and exercise. We  have discussed specific strategies for this.  Current medicines are reviewed at length with the patient today.  The patient does not have concerns regarding medicines.  The following changes have been made:  no change  Labs/ tests ordered today include: None  Orders Placed This Encounter  Procedures  . EKG 12-Lead     Disposition:   FU with me I about two years.     Signed, Rollene Rotunda, MD  11/19/2018 3:27 PM  [facility]

## 2018-11-19 ENCOUNTER — Other Ambulatory Visit: Payer: Self-pay

## 2018-11-19 ENCOUNTER — Encounter: Payer: Self-pay | Admitting: Cardiology

## 2018-11-19 ENCOUNTER — Ambulatory Visit: Payer: BLUE CROSS/BLUE SHIELD | Admitting: Cardiology

## 2018-11-19 VITALS — BP 120/60 | HR 64 | Ht 71.0 in | Wt 254.0 lb

## 2018-11-19 DIAGNOSIS — E785 Hyperlipidemia, unspecified: Secondary | ICD-10-CM | POA: Diagnosis not present

## 2018-11-19 DIAGNOSIS — I2510 Atherosclerotic heart disease of native coronary artery without angina pectoris: Secondary | ICD-10-CM

## 2018-11-19 DIAGNOSIS — I10 Essential (primary) hypertension: Secondary | ICD-10-CM | POA: Diagnosis not present

## 2018-11-19 NOTE — Patient Instructions (Signed)
Medication Instructions:  The current medical regimen is effective;  continue present plan and medications.  If you need a refill on your cardiac medications before your next appointment, please call your pharmacy.   Follow-Up: Follow up in 2 years with Dr. Hochrein.  You will receive a letter in the mail 2 months before you are due.  Please call us when you receive this letter to schedule your follow up appointment.  Thank you for choosing [facility]!!      

## 2018-12-02 ENCOUNTER — Other Ambulatory Visit: Payer: Self-pay | Admitting: Family Medicine

## 2018-12-03 ENCOUNTER — Other Ambulatory Visit: Payer: Self-pay | Admitting: Family Medicine

## 2018-12-03 DIAGNOSIS — G4733 Obstructive sleep apnea (adult) (pediatric): Secondary | ICD-10-CM | POA: Diagnosis not present

## 2018-12-11 ENCOUNTER — Other Ambulatory Visit: Payer: Self-pay | Admitting: Nurse Practitioner

## 2018-12-15 ENCOUNTER — Other Ambulatory Visit: Payer: Self-pay | Admitting: Family Medicine

## 2019-01-07 ENCOUNTER — Other Ambulatory Visit: Payer: Self-pay

## 2019-01-07 ENCOUNTER — Ambulatory Visit (INDEPENDENT_AMBULATORY_CARE_PROVIDER_SITE_OTHER): Payer: BLUE CROSS/BLUE SHIELD | Admitting: Family Medicine

## 2019-01-07 DIAGNOSIS — I10 Essential (primary) hypertension: Secondary | ICD-10-CM | POA: Diagnosis not present

## 2019-01-07 DIAGNOSIS — E039 Hypothyroidism, unspecified: Secondary | ICD-10-CM

## 2019-01-07 DIAGNOSIS — N400 Enlarged prostate without lower urinary tract symptoms: Secondary | ICD-10-CM

## 2019-01-07 DIAGNOSIS — E1169 Type 2 diabetes mellitus with other specified complication: Secondary | ICD-10-CM | POA: Diagnosis not present

## 2019-01-07 DIAGNOSIS — E559 Vitamin D deficiency, unspecified: Secondary | ICD-10-CM

## 2019-01-07 DIAGNOSIS — E118 Type 2 diabetes mellitus with unspecified complications: Secondary | ICD-10-CM

## 2019-01-07 DIAGNOSIS — E785 Hyperlipidemia, unspecified: Secondary | ICD-10-CM

## 2019-01-07 DIAGNOSIS — K635 Polyp of colon: Secondary | ICD-10-CM

## 2019-01-07 DIAGNOSIS — E109 Type 1 diabetes mellitus without complications: Secondary | ICD-10-CM

## 2019-01-07 MED ORDER — ONETOUCH DELICA LANCETS 30G MISC: 1.0000 | Freq: Four times a day (QID) | 4 refills | Status: DC | PRN

## 2019-01-07 NOTE — Progress Notes (Signed)
Virtual Visit Via telephone Note I connected with@ on 01/07/19 by telephone and verified that I am speaking with the correct person or authorized healthcare agent using two identifiers. Joseph Kirby is currently located at home and there are no unauthorized people in close proximity. I completed this visit while in a private location in my home .  Connected to the patient by telephone and verified that our speaking with the correct person.  This visit type was conducted due to national recommendations for restrictions regarding the COVID-19 Pandemic (e.g. social distancing).  This format is felt to be most appropriate for this patient at this time.  All issues noted in this document were discussed and addressed.  No physical exam was performed.    I discussed the limitations, risks, security and privacy concerns of performing an evaluation and management service by telephone and the availability of in person appointments. I also discussed with the patient that there may be a patient responsible charge related to this service. The patient expressed understanding and agreed to proceed.   Date:  01/07/2019    ID:  Jaquita Folds      July 08, 1956        [id number]   Patient Care Team Patient Care Team: Ernestina Penna, MD as PCP - General (Family Medicine)  Reason for Visit: Primary Care Follow-up     History of Present Illness & Review of Systems:     [name] is a 63 y.o. year old male primary care patient that presents today for a telehealth visit.  Is doing well as usual.  He has not been working since January because he was laid off of work and is thinking about applying for early retirement and is now on his Gaffer.  He recently saw the cardiologist and will not see him again for 2 years.  He had an eye exam in January at my eye doctor.  He denies any chest pain pressure tightness or shortness of breath.  He denies any trouble with nausea vomiting diarrhea blood in the  stool black tarry bowel movements or change in bowel habits.  We do need to look into when he needs to have his next colonoscopy.  There is been some confusion about that and we will call and check with Dr. Karilyn Cota regarding that need.  Passing his water without problems.  His blood sugars tend to range from the 80-90 up to 120 range and rarely as high as 200.  Mostly toward the lower end of the range.  His weight is 253.  He is not checking blood pressures at home.  His feet are good without any sores.  He does not need any medicines refilled at this time.  They have been sending him the wrong lancets.  He needs the One Touch delicate and we will make sure that order gets put in and sent out for him.  He will come to the office in about 4 weeks for fasting blood work and will make sure that we get a vitamin D level at that time.  Review of systems as stated otherwise negative for body systems left unmentioned.   The patient does not have symptoms concerning for COVID-19 infection (fever, chills, cough, or new shortness of breath).      Current Medications (Verified) Allergies as of 01/07/2019      Reactions   Ezetimibe-simvastatin    Iodine Swelling   Shrimp [shellfish Allergy] Swelling  Medication List       Accurate as of January 07, 2019  2:57 PM. Always use your most recent med list.        aspirin 81 MG tablet Take 81 mg by mouth daily.   atorvastatin 80 MG tablet Commonly known as:  LIPITOR Take 1 tablet (80 mg total) by mouth daily.   FreeStyle [employer] 1 each by Does not apply route daily.   FreeStyle Libre Sensor System Misc APPLY 1 SENSOR SUBCUTANEOUSLY EVERY 10 DAYS   insulin NPH Human 100 UNIT/ML injection Commonly known as:  HumuLIN N INJECT SUBCUTANEOUSLY 56  UNITS IN THE MORNING AND 46 UNITS IN THE EVENING AS  DIRECTED   insulin regular 100 units/mL injection Commonly known as:  HumuLIN R INJECT SUBCUTANEOUSLY 20  UNITS TWO TIMES DAILY  BEFORE MEALS    Insulin Syringe-Needle U-100 30G X 1/2" 1 ML Misc Commonly known as:  B-D INS SYR ULTRAFINE 1CC/30G USE AS DIRECTED 3 TIMES  DAILY   levothyroxine 175 MCG tablet Commonly known as:  SYNTHROID TAKE 1 TABLET BY MOUTH  DAILY , AND NONE ON SUNDAY   niacin 500 MG CR tablet Commonly known as:  NIASPAN TAKE 3 TABLETS BY MOUTH AT  BEDTIME   nystatin-triamcinolone cream Commonly known as:  MYCOLOG II APPLY 1 APPLICATION 2 (TWO) TIMES DAILY TOPICALLY.   omega-3 acid ethyl esters 1 g capsule Commonly known as:  Lovaza Take 2 capsules (2 g total) by mouth 2 (two) times daily.   ONE TOUCH ULTRA TEST test strip Generic drug:  glucose blood TEST TWO TIMES DAILY   onetouch ultrasoft lancets TEST TWO TIMES DAILY   ramipril 5 MG capsule Commonly known as:  ALTACE Take 1 capsule (5 mg total) by mouth daily.   sildenafil 20 MG tablet Commonly known as:  REVATIO Take 2-5 tabs PRN prior to Sexual activity.   Vascepa 1 g Caps Generic drug:  Icosapent Ethyl TAKE 2 CAPSULES BY MOUTH  TWO TIMES DAILY   Vitamin D 50 MCG (2000 UT) Caps Take 2 capsules by mouth daily.           Allergies (Verified)    Ezetimibe-simvastatin; Iodine; and Shrimp [shellfish allergy]  Past Medical History Past Medical History:  Diagnosis Date   BPH (benign prostatic hyperplasia)    CAD (coronary artery disease)    Nonobstructive disease by catheterization, 2003   Colon polyp    Diabetes mellitus    Ejection fraction    EF 55%, catheterization, 2003   Hyperlipidemia    Hypothyroidism    Sleep apnea      Past Surgical History:  Procedure Laterality Date   CARDIAC CATHETERIZATION  2003   NONOBSTRUCTIVE, EF 55%   NASAL SEPTUM SURGERY      Social History   Socioeconomic History   Marital status: Married    Spouse name: Not on file   Number of children: 2   Years of education: Not on file   Highest education level: Not on file  Occupational History   Not on file  Social Needs    Financial resource strain: Not on file   Food insecurity:    Worry: Not on file    Inability: Not on file   Transportation needs:    Medical: Not on file    Non-medical: Not on file  Tobacco Use   Smoking status: Former Smoker    Types: Cigarettes    Last attempt to quit: 03/17/1993    Years  since quitting: 25.8   Smokeless tobacco: Never Used  Substance and Sexual Activity   Alcohol use: No   Drug use: No   Sexual activity: Never  Lifestyle   Physical activity:    Days per week: Not on file    Minutes per session: Not on file   Stress: Not on file  Relationships   Social connections:    Talks on phone: Not on file    Gets together: Not on file    Attends religious service: Not on file    Active member of club or organization: Not on file    Attends meetings of clubs or organizations: Not on file    Relationship status: Not on file  Other Topics Concern   Not on file  Social History Narrative   Lives at home with wife and daughter and husband and two children.       Family History  Problem Relation Age of Onset   Heart disease Father        Heart failure   Diabetes Sister    Cancer Brother        stomach      Labs/Other Tests and Data Reviewed:    Wt Readings from Last 3 Encounters:  11/19/18 254 lb (115.2 kg)  09/08/18 253 lb (114.8 kg)  05/01/18 253 lb (114.8 kg)   Temp Readings from Last 3 Encounters:  09/08/18 97.8 F (36.6 C) (Oral)  05/01/18 98.5 F (36.9 C) (Oral)  12/30/17 97.9 F (36.6 C) (Oral)   BP Readings from Last 3 Encounters:  11/19/18 120/60  09/08/18 (!) 97/48  05/01/18 (!) 93/54   Pulse Readings from Last 3 Encounters:  11/19/18 64  09/08/18 63  05/01/18 76     Lab Results  Component Value Date   HGBA1C 6.9 09/23/2018   HGBA1C 7.0 (H) 05/01/2018   HGBA1C 7.5 (H) 12/30/2017   Lab Results  Component Value Date   MICROALBUR 20 09/09/2014   LDLCALC 65 09/23/2018   CREATININE 1.02 09/23/2018        Chemistry      Component Value Date/Time   NA 142 09/23/2018 0858   K 4.3 09/23/2018 0858   CL 104 09/23/2018 0858   CO2 25 09/23/2018 0858   BUN 16 09/23/2018 0858   CREATININE 1.02 09/23/2018 0858   CREATININE 0.90 12/25/2012 0952      Component Value Date/Time   CALCIUM 9.5 09/23/2018 0858   ALKPHOS 117 09/23/2018 0858   AST 17 09/23/2018 0858   ALT 31 09/23/2018 0858   BILITOT 0.5 09/23/2018 0858         OBSERVATIONS/ OBJECTIVE:     Patient is pleasant and laid back as usual.  He was able to answer all my questions without difficulty.  He is currently no longer working.  He is staying busy at home and plans to apply for his Medicare.  He says his blood sugars at home is been running anywhere between 80 and 120 the majority of the times fasting.  He does not check blood pressures at home.  His current weight is 253 pounds.  He saw the eye doctor in January.  He is in need I think of a colonoscopy and we will make sure that we check this out and get this done at the time that it is to be done.  He does not need any refills and his allergies have not changed.  I have been sending him the wrong kind of lancets.  He needs the One Touch delicate he said.  Physical exam deferred due to nature of telephonic visit.  ASSESSMENT & PLAN    Time:   Today, I have spent 25 minutes with the patient via telephone discussing the above including Covid precautions.     Visit Diagnoses: 1. Essential hypertension -Medicines and check blood pressure readings more often at home  2. Vitamin D deficiency -Continue with current vitamin D dosing which is 4000 units daily but check vitamin D level at next blood draw  3. Type 2 diabetes mellitus without complication with long-term use of insulin -Continue to check blood sugars regularly  4. Type 2 diabetes mellitus with hyperlipidemia (HCC) -Continue to follow as aggressive therapeutic lifestyle changes as possible and continue with current  treatment.  5. Brittle diabetes (HCC) -Monitor blood sugars regularly and keep feet checked regularly and keep weight down as much as possible with diet and exercise  6. Polyp of colon, unspecified part of colon, unspecified type -Check and confirm the date of the next colonoscopy with the gastroenterologist and let patient know when this can be done.  7. Primary hypothyroidism -Tinea current treatment pending results of thyroid test  8. Benign prostatic hyperplasia without lower urinary tract symptoms -No complaints today with voiding and patient does not use sildenafil.   Patient Instructions  Please come by the office and get routine blood work at [facility].  Fasting blood work would be preferred. The last blood work was done in January so we could plan to get the next blood work sometime toward the end of May at the patient's convenience. The patient should drink plenty of water and stay well-hydrated The patient should continue to follow his diet closely and keep his weight under the best control possible with diet and exercise He should continue to practice good hand and respiratory hygiene He should check his feet regularly.       The above assessment and management plan was discussed with the patient. The patient verbalized understanding of and has agreed to the management plan. Patient is aware to call the clinic if symptoms persist or worsen. Patient is aware when to return to the clinic for a follow-up visit. Patient educated on when it is appropriate to go to the emergency department.    Ernestina Penna, MD [facility] [address] [place], [place], [place] [address] Ph [phone]   Nyra Capes MD

## 2019-01-07 NOTE — Patient Instructions (Addendum)
Please come by the office and get routine blood work at [facility].  Fasting blood work would be preferred. The last blood work was done in January so we could plan to get the next blood work sometime toward the end of May at the patient's convenience. The patient should drink plenty of water and stay well-hydrated The patient should continue to follow his diet closely and keep his weight under the best control possible with diet and exercise He should continue to practice good hand and respiratory hygiene He should check his feet regularly.

## 2019-01-07 NOTE — Addendum Note (Signed)
Addended by: Magdalene River on: 01/07/2019 04:25 PM   Modules accepted: Orders

## 2019-01-14 ENCOUNTER — Telehealth: Payer: Self-pay | Admitting: Family Medicine

## 2019-01-14 NOTE — Telephone Encounter (Signed)
LM for pt to call back to get signed up for mychart

## 2019-01-20 DIAGNOSIS — G4733 Obstructive sleep apnea (adult) (pediatric): Secondary | ICD-10-CM | POA: Diagnosis not present

## 2019-01-29 ENCOUNTER — Other Ambulatory Visit: Payer: Self-pay | Admitting: *Deleted

## 2019-01-29 MED ORDER — ONETOUCH DELICA LANCETS 30G MISC: 1.0000 | Freq: Two times a day (BID) | 4 refills | Status: AC

## 2019-02-09 ENCOUNTER — Other Ambulatory Visit: Payer: Self-pay | Admitting: Family Medicine

## 2019-02-20 ENCOUNTER — Other Ambulatory Visit: Payer: Self-pay | Admitting: Family Medicine

## 2019-04-01 ENCOUNTER — Other Ambulatory Visit: Payer: Self-pay | Admitting: Family Medicine

## 2019-05-25 ENCOUNTER — Other Ambulatory Visit: Payer: Self-pay | Admitting: Family Medicine

## 2019-06-08 ENCOUNTER — Other Ambulatory Visit: Payer: Self-pay

## 2019-06-09 ENCOUNTER — Encounter: Payer: Self-pay | Admitting: Family Medicine

## 2019-06-09 ENCOUNTER — Ambulatory Visit: Payer: BLUE CROSS/BLUE SHIELD | Admitting: Family Medicine

## 2019-06-09 VITALS — BP 126/64 | HR 61 | Temp 99.1°F | Ht 71.0 in | Wt 253.0 lb

## 2019-06-09 DIAGNOSIS — Z1211 Encounter for screening for malignant neoplasm of colon: Secondary | ICD-10-CM

## 2019-06-09 DIAGNOSIS — I152 Hypertension secondary to endocrine disorders: Secondary | ICD-10-CM

## 2019-06-09 DIAGNOSIS — I2510 Atherosclerotic heart disease of native coronary artery without angina pectoris: Secondary | ICD-10-CM | POA: Diagnosis not present

## 2019-06-09 DIAGNOSIS — Z794 Long term (current) use of insulin: Secondary | ICD-10-CM | POA: Diagnosis not present

## 2019-06-09 DIAGNOSIS — E1159 Type 2 diabetes mellitus with other circulatory complications: Secondary | ICD-10-CM

## 2019-06-09 DIAGNOSIS — E113293 Type 2 diabetes mellitus with mild nonproliferative diabetic retinopathy without macular edema, bilateral: Secondary | ICD-10-CM

## 2019-06-09 DIAGNOSIS — E039 Hypothyroidism, unspecified: Secondary | ICD-10-CM | POA: Diagnosis not present

## 2019-06-09 DIAGNOSIS — I10 Essential (primary) hypertension: Secondary | ICD-10-CM | POA: Diagnosis not present

## 2019-06-09 DIAGNOSIS — K219 Gastro-esophageal reflux disease without esophagitis: Secondary | ICD-10-CM

## 2019-06-09 DIAGNOSIS — E1169 Type 2 diabetes mellitus with other specified complication: Secondary | ICD-10-CM | POA: Insufficient documentation

## 2019-06-09 DIAGNOSIS — Z23 Encounter for immunization: Secondary | ICD-10-CM | POA: Diagnosis not present

## 2019-06-09 DIAGNOSIS — E785 Hyperlipidemia, unspecified: Secondary | ICD-10-CM

## 2019-06-09 LAB — BAYER DCA HB A1C WAIVED: HB A1C (BAYER DCA - WAIVED): 6.6 % (ref <7.0)

## 2019-06-09 MED ORDER — NIACIN ER (ANTIHYPERLIPIDEMIC) 500 MG PO TBCR: 1500.0000 mg | EXTENDED_RELEASE_TABLET | Freq: Every day | ORAL | 1 refills | Status: AC

## 2019-06-09 MED ORDER — INSULIN NPH (HUMAN) (ISOPHANE) 100 UNIT/ML ~~LOC~~ SUSP: SUBCUTANEOUS | 3 refills | Status: AC

## 2019-06-09 MED ORDER — ATORVASTATIN CALCIUM 80 MG PO TABS: 80.0000 mg | ORAL_TABLET | Freq: Every day | ORAL | 3 refills | Status: AC

## 2019-06-09 MED ORDER — OMEGA-3-ACID ETHYL ESTERS 1 G PO CAPS: 2.0000 g | ORAL_CAPSULE | Freq: Two times a day (BID) | ORAL | 3 refills | Status: DC

## 2019-06-09 MED ORDER — INSULIN REGULAR HUMAN 100 UNIT/ML IJ SOLN: INTRAMUSCULAR | 3 refills | Status: AC

## 2019-06-09 NOTE — Progress Notes (Signed)
Subjective: CC: DM2, HTN, HLD, Thyroid PCP: No primary care provider on file. Joseph Kirby is a 63 y.o. male presenting to clinic today for:  1. Type 2 Diabetes w/ HTN, HLD and CAD:  Patient reports compliance with insulin.  He injects 66 units of the Humulin in each morning and 46 units in the evening.  He injects up to 20 units of Humulin R if blood sugars greater than 100 and if blood sugars less than 100 he only inject 16 units.  He self mixes.  He saw his eye doctor and had retinopathy on his last eye exam in February.  He notes that he had a hospitalization for DKA in the past after he was trialed on an alternative form of insulin.  He has since resumed use of his current insulins and had no issues.  He is compliant with atorvastatin 80 mg daily, Vascepa, Niaspan and Altace.  Last eye exam: +retinopathy, 2020 Last foot exam: needs 08/2019 Last A1c:  Lab Results  Component Value Date   HGBA1C 6.9 09/23/2018   Nephropathy screen indicated?: on ACE-I Last flu, zoster and/or pneumovax: needs PNA, Flu shots Immunization History  Administered Date(s) Administered  . Hepatitis B 12/13/2016, 03/20/2017  . Hepatitis B, adult 06/17/2017  . Influenza Inj Mdck Quad Pf 06/13/2017  . Influenza Whole 07/11/2010  . Influenza,inj,Quad PF,6+ Mos 07/31/2013, 06/28/2014, 06/24/2015, 07/02/2016, 07/30/2018  . Td 10/12/2007  . Tdap 12/30/2017  . Zoster 02/04/2013    ROS: Denies dizziness, LOC, polyuria, polydipsia, unintended weight loss/gain, foot ulcerations, numbness or tingling in extremities, shortness of breath or chest pain.  2. Hypothyroidism History: History of radioablation of the thyroid for hyperthyroidism greater than 30 years ago.  His family is positive for hypothyroidism in his mother and sister as well He reports compliance with Synthroid daily.  Denies any change in voice, difficulty swallowing, tremor, visual disturbance.  3.  GERD Patient reports occasional epigastric  pain.  This occurs a few times per week.  He was previously on ranitidine but that was discontinued due to recall.  He was advised to use Pepcid but has not started that yet because he forgot about it.  Denies any nausea, vomiting, GI bleed.  He has not yet had a colonoscopy but is waiting for an appointment for screening colonoscopy in [place].  ROS: Per HPI  Allergies  Allergen Reactions  . Ezetimibe-Simvastatin   . Iodine Swelling  . Shrimp [Shellfish Allergy] Swelling   Past Medical History:  Diagnosis Date  . BPH (benign prostatic hyperplasia)   . CAD (coronary artery disease)    Nonobstructive disease by catheterization, 2003  . Colon polyp   . Diabetes mellitus   . DYSLIPIDEMIA 03/27/2010   Qualifier: Diagnosis of  By: Ron Parker, MD, Leonidas Romberg Dorinda Hill   . Ejection fraction    EF 55%, catheterization, 2003  . Hyperlipidemia   . Hypothyroidism   . Sleep apnea     Current Outpatient Medications:  .  aspirin 81 MG tablet, Take 81 mg by mouth daily.  , Disp: , Rfl:  .  atorvastatin (LIPITOR) 80 MG tablet, Take 1 tablet (80 mg total) by mouth daily., Disp: 90 tablet, Rfl: 3 .  Cholecalciferol (VITAMIN D) 2000 UNITS CAPS, Take 2 capsules by mouth daily., Disp: , Rfl:  .  Continuous Blood Gluc Receiver (FREESTYLE LIBRE READER) DEVI, 1 each by Does not apply route daily., Disp: 1 Device, Rfl: 0 .  Continuous Blood Gluc Sensor ([place]) MISC,  APPLY 1 SENSOR SUBCUTANEOUSLY EVERY 10 DAYS, Disp: 3 each, Rfl: 3 .  glucose blood (ONETOUCH ULTRA) test strip, Test twice daily Dx E11.9, Disp: 200 strip, Rfl: 3 .  insulin NPH Human (HUMULIN N) 100 UNIT/ML injection, INJECT SUBCUTANEOUSLY 56  UNITS IN THE MORNING AND 46 UNITS IN THE EVENING AS  DIRECTED, Disp: 100 mL, Rfl: 0 .  insulin regular (HUMULIN R) 100 units/mL injection, INJECT SUBCUTANEOUSLY 20  UNITS TWO TIMES DAILY  BEFORE MEALS, Disp: 40 mL, Rfl: 0 .  Insulin Syringe-Needle U-100 (B-D INS SYR ULTRAFINE 1CC/30G)  30G X 1/2" 1 ML MISC, USE AS DIRECTED 3 TIMES  DAILY Dx E11.9, Disp: 300 each, Rfl: 3 .  levothyroxine (SYNTHROID) 175 MCG tablet, TAKE 1 TABLET BY MOUTH  DAILY , AND NONE ON SUNDAY, Disp: 78 tablet, Rfl: 0 .  niacin (NIASPAN) 500 MG CR tablet, TAKE 3 TABLETS BY MOUTH AT  BEDTIME, Disp: 270 tablet, Rfl: 1 .  nystatin-triamcinolone (MYCOLOG II) cream, APPLY 1 APPLICATION 2 (TWO) TIMES DAILY TOPICALLY., Disp: 30 g, Rfl: 1 .  omega-3 acid ethyl esters (LOVAZA) 1 g capsule, Take 2 capsules (2 g total) by mouth 2 (two) times daily., Disp: 360 capsule, Rfl: 3 .  OneTouch Delica Lancets 00F MISC, 1 applicator by Does not apply route 2 (two) times a day., Disp: 200 each, Rfl: 4 .  ramipril (ALTACE) 5 MG capsule, TAKE 1 CAPSULE BY MOUTH  DAILY, Disp: 90 capsule, Rfl: 3 .  sildenafil (REVATIO) 20 MG tablet, Take 2-5 tabs PRN prior to Sexual activity. (Patient not taking: Reported on 01/07/2019), Disp: 50 tablet, Rfl: 2 .  VASCEPA 1 g CAPS, TAKE 2 CAPSULES BY MOUTH  TWO TIMES DAILY, Disp: 360 capsule, Rfl: 0 Social History   Socioeconomic History  . Marital status: Married    Spouse name: Not on file  . Number of children: 2  . Years of education: Not on file  . Highest education level: Not on file  Occupational History  . Not on file  Social Needs  . Financial resource strain: Not on file  . Food insecurity    Worry: Not on file    Inability: Not on file  . Transportation needs    Medical: Not on file    Non-medical: Not on file  Tobacco Use  . Smoking status: Former Smoker    Types: Cigarettes    Quit date: 03/17/1993    Years since quitting: 26.2  . Smokeless tobacco: Never Used  Substance and Sexual Activity  . Alcohol use: No  . Drug use: No  . Sexual activity: Never  Lifestyle  . Physical activity    Days per week: Not on file    Minutes per session: Not on file  . Stress: Not on file  Relationships  . Social Herbalist on phone: Not on file    Gets together: Not on  file    Attends religious service: Not on file    Active member of club or organization: Not on file    Attends meetings of clubs or organizations: Not on file    Relationship status: Not on file  . Intimate partner violence    Fear of current or ex partner: Not on file    Emotionally abused: Not on file    Physically abused: Not on file    Forced sexual activity: Not on file  Other Topics Concern  . Not on file  Social History Narrative   Lives  at home with wife and daughter and husband and two children.     Family History  Problem Relation Age of Onset  . Heart disease Father        Heart failure  . Diabetes Sister   . Cancer Brother        stomach    Objective: Office vital signs reviewed. There were no vitals taken for this visit.  Physical Examination:  General: Awake, alert, well nourished, No acute distress HEENT: Normal; no exophthalmos no goiter Cardio: regular rate and rhythm, S1S2 heard, mild systolic murmur noted on the left sternal border (1/6) Pulm: clear to auscultation bilaterally, no wheezes, rhonchi or rales; normal work of breathing on room air Extremities: warm, well perfused, No edema, cyanosis or clubbing; +2 pulses bilaterally MSK: normal gait and station  Assessment/ Plan: 63 y.o. male   1. 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  Continue current insulin regimen.  Refills have been sent - CMP14+EGFR - Bayer DCA Hb A1c Waived  2. Hypertension associated with diabetes ([place]) Controlled.  Continue current regimen - CMP14+EGFR  3. Hyperlipidemia associated with type 2 diabetes mellitus (HCC) Continue statin, Vascepa and Niaspan.  Not having any adverse side effects from Niaspan - CMP14+EGFR  4. Coronary artery disease involving native coronary artery of native heart  without angina pectoris - Thyroid Panel With TSH  5. Primary hypothyroidism Asymptomatic.  Check thyroid pain - Thyroid Panel With TSH  6. Gastroesophageal reflux disease without esophagitis He will start Pepcid up to twice daily as needed acid reflux.  We discussed that if he is having at least 3 episodes of acid reflux per week we could consider initiation of PPI instead  7. Screen for colon cancer I placed a new referral to GI for colonoscopy - Ambulatory referral to Gastroenterology  8. Need for immunization against influenza Administered during today's visit - Flu Vaccine QUAD 36+ mos IM   Orders Placed This Encounter  Procedures  . Pneumococcal polysaccharide vaccine 23-valent greater than or equal to 2yo subcutaneous/IM  . Flu Vaccine QUAD 36+ mos IM  . CMP14+EGFR  . Bayer DCA Hb A1c Waived  . Thyroid Panel With TSH  . Ambulatory referral to Gastroenterology    Referral Priority:   Routine    Referral Type:   Consultation    Referral Reason:   Specialty Services Required    Number of Visits Requested:   1   Meds ordered this encounter  Medications  . atorvastatin (LIPITOR) 80 MG tablet    Sig: Take 1 tablet (80 mg total) by mouth daily.    Dispense:  90 tablet    Refill:  3  . insulin NPH Human (HUMULIN N) 100 UNIT/ML injection    Sig: INJECT SUBCUTANEOUSLY 66 UNITS IN THE MORNING AND 46 UNITS IN THE EVENING AS DIRECTED    Dispense:  100 mL    Refill:  3  . insulin regular (HUMULIN R) 100 units/mL injection    Sig: INJECT SUBCUTANEOUSLY 20  UNITS TWO TIMES DAILY  BEFORE MEALS    Dispense:  40 mL    Refill:  3  . niacin (NIASPAN) 500 MG CR tablet    Sig: Take 3 tablets (1,500 mg total) by mouth at bedtime.    Dispense:  270 tablet    Refill:  1  . omega-3 acid  ethyl esters (LOVAZA) 1 g capsule    Sig: Take 2 capsules (2 g total) by mouth 2 (two) times daily.    Dispense:  360 capsule    Refill:  [place], [place] [phone]

## 2019-06-09 NOTE — Patient Instructions (Addendum)
Sugar looks great.  I will send in the thyroid medication tomorrow after I get your lab back.  You had labs performed today.  You will be contacted with the results of the labs once they are available, usually in the next 3 business days for routine lab work.  If you have an active my chart account, they will be released to your MyChart.  If you prefer to have these labs released to you via telephone, please let us know.  If you had a pap smear or biopsy performed, expect to be contacted in about 7-10 days.

## 2019-06-10 LAB — CMP14+EGFR
ALT: 33 IU/L (ref 0–44)
AST: 28 IU/L (ref 0–40)
Albumin/Globulin Ratio: 1.9 (ref 1.2–2.2)
Albumin: 3.5 g/dL — ABNORMAL LOW (ref 3.8–4.8)
Alkaline Phosphatase: 136 IU/L — ABNORMAL HIGH (ref 39–117)
BUN/Creatinine Ratio: 17 (ref 10–24)
BUN: 18 mg/dL (ref 8–27)
Bilirubin Total: 0.7 mg/dL (ref 0.0–1.2)
CO2: 24 mmol/L (ref 20–29)
Calcium: 9.2 mg/dL (ref 8.6–10.2)
Chloride: 104 mmol/L (ref 96–106)
Creatinine, Ser: 1.08 mg/dL (ref 0.76–1.27)
GFR calc Af Amer: 85 mL/min/{1.73_m2} (ref >59)
GFR calc non Af Amer: 73 mL/min/{1.73_m2} (ref >59)
Globulin, Total: 1.8 g/dL (ref 1.5–4.5)
Glucose: 398 mg/dL — ABNORMAL HIGH (ref 65–99)
Potassium: 4.5 mmol/L (ref 3.5–5.2)
Sodium: 138 mmol/L (ref 134–144)
Total Protein: 5.3 g/dL — ABNORMAL LOW (ref 6.0–8.5)

## 2019-06-10 LAB — THYROID PANEL WITH TSH
Free Thyroxine Index: 2.1 (ref 1.2–4.9)
T3 Uptake Ratio: 34 % (ref 24–39)
T4, Total: 6.1 ug/dL (ref 4.5–12.0)
TSH: 0.223 u[IU]/mL — ABNORMAL LOW (ref 0.450–4.500)

## 2019-06-12 ENCOUNTER — Telehealth: Payer: Self-pay | Admitting: *Deleted

## 2019-06-12 DIAGNOSIS — E1169 Type 2 diabetes mellitus with other specified complication: Secondary | ICD-10-CM

## 2019-06-12 NOTE — Telephone Encounter (Signed)
Pt needs PA for Lovaza  In his last OV notes you said for him to continue Vascepa? Did you mean to switch him?  And did you want brand name only or is generic ok if you did want to switch to Lovaza.

## 2019-06-12 NOTE — Telephone Encounter (Signed)
I meant Lovaza That's what he wanted refilled.  He didn't want vascepa

## 2019-06-14 NOTE — Progress Notes (Signed)
Murmur heard by Janora Norlander, DO.  I will move up his follow up from 2022 to 2021 March.  I did not appreciate this before and said to be soft murmur without symptoms.

## 2019-06-15 ENCOUNTER — Encounter: Payer: Self-pay | Admitting: Gastroenterology

## 2019-06-16 NOTE — Telephone Encounter (Signed)
Trying to get the PA but CoverMyMeds keeps giving me an error message when I try to send the PA over and I don't see an updated Insurance card in the pt's chart. Will call pt to try and make sure everything is still the same.   Spoke with pt and he states his insurance will pay for the generic Lovaza and he shouldn't need a PA. Pt states he will call [employer] and ask them and call me back to let me know.

## 2019-06-18 ENCOUNTER — Telehealth: Payer: Self-pay | Admitting: Family Medicine

## 2019-06-18 NOTE — Telephone Encounter (Signed)
Pt has multiple calls open regarding this, please see previous call, will close this encounter.

## 2019-06-18 NOTE — Telephone Encounter (Signed)
This pt has multiple calls open regarding this. See previous call, will close this encounter.

## 2019-06-19 ENCOUNTER — Telehealth: Payer: Self-pay | Admitting: Family Medicine

## 2019-06-19 NOTE — Telephone Encounter (Signed)
Please see previous call.

## 2019-06-22 NOTE — Telephone Encounter (Signed)
Spoke with pt and he states he spoke with his insurance company and they said it went through and no PA needed.

## 2019-07-23 DIAGNOSIS — G4733 Obstructive sleep apnea (adult) (pediatric): Secondary | ICD-10-CM | POA: Diagnosis not present

## 2019-07-28 ENCOUNTER — Other Ambulatory Visit: Payer: Self-pay

## 2019-07-28 ENCOUNTER — Ambulatory Visit (INDEPENDENT_AMBULATORY_CARE_PROVIDER_SITE_OTHER): Payer: Self-pay | Admitting: *Deleted

## 2019-07-28 DIAGNOSIS — Z1211 Encounter for screening for malignant neoplasm of colon: Secondary | ICD-10-CM

## 2019-07-28 MED ORDER — PEG 3350-KCL-NA BICARB-NACL 420 G PO SOLR: 4000.0000 mL | Freq: Once | ORAL | 0 refills | Status: AC

## 2019-07-28 NOTE — Patient Instructions (Signed)
Joseph Kirby   1956-05-19 MRN: [id number]    Procedure Date: 10/07/2019 Time to register: 11:00 am Place to register: [facility] Procedure Time: 12:00 pm Scheduled provider: Dr. Gala Romney  PREPARATION FOR COLONOSCOPY WITH TRI-LYTE SPLIT PREP  Please notify us immediately if you are diabetic, take iron supplements, or if you are on Coumadin or any other blood thinners.   Please hold the following medications: See letter  You will need to purchase 1 fleet enema and 1 box of Bisacodyl 42m tablets.   2 DAYS BEFORE PROCEDURE:  DATE: 10/05/2019  DAY: Monday Begin clear liquid diet AFTER your lunch meal. NO SOLID FOODS after this point.  1 DAY BEFORE PROCEDURE:  DATE: 10/06/2019  DAY: Tuesday Continue clear liquids the entire day - NO SOLID FOOD.   Diabetic medications adjustments for today: See letter  At 2:00 pm:  Take 2 Bisacodyl tablets.   At 4:00pm:  Start drinking your solution. Make sure you mix well per instructions on the bottle. Try to drink 1 (one) 8 ounce glass every 10-15 minutes until you have consumed HALF the jug. You should complete by 6:00pm.You must keep the left over solution refrigerated until completed next day.  Continue clear liquids. You must drink plenty of clear liquids to prevent dehyration and kidney failure.     DAY OF PROCEDURE:   DATE: 10/07/2019   DAY: Wednesday If you take medications for your heart, blood pressure or breathing, you may take these medications.  Diabetic medications adjustments for today: See letter  Five hours before your procedure time @  7:00 am:  Finish remaining amout of bowel prep, drinking 1 (one) 8 ounce glass every 10-15 minutes until complete. You have two hours to consume remaining prep.   Three hours before your procedure time @ 9:00 am:  Nothing by mouth.   At least one hour before going to the hospital:  Give yourself one Fleet enema. You may take your morning medications with sip of water unless we have instructed  otherwise.      Please see below for Dietary Information.  CLEAR LIQUIDS INCLUDE:  Water Jello (NOT red in color)   Ice Popsicles (NOT red in color)   Tea (sugar ok, no milk/cream) Powdered fruit flavored drinks  Coffee (sugar ok, no milk/cream) Gatorade/ Lemonade/ Kool-Aid  (NOT red in color)   Juice: apple, white grape, white cranberry Soft drinks  Clear bullion, consomme, broth (fat free beef/chicken/vegetable)  Carbonated beverages (any kind)  Strained chicken noodle soup Hard Candy   Remember: Clear liquids are liquids that will allow you to see your fingers on the other side of a clear glass. Be sure liquids are NOT red in color, and not cloudy, but CLEAR.  DO NOT EAT OR DRINK ANY OF THE FOLLOWING:  Dairy products of any kind   Cranberry juice Tomato juice / V8 juice   Grapefruit juice Orange juice     Red grape juice  Do not eat any solid foods, including such foods as: cereal, oatmeal, yogurt, fruits, vegetables, creamed soups, eggs, bread, crackers, pureed foods in a blender, etc.   HELPFUL HINTS FOR DRINKING PREP SOLUTION:   Make sure prep is extremely cold. Mix and refrigerate the the morning of the prep. You may also put in the freezer.   You may try mixing some Crystal Light or Country Time Lemonade if you prefer. Mix in small amounts; add more if necessary.  Try drinking through a straw  Rinse mouth with  water or a mouthwash between glasses, to remove after-taste.  Try sipping on a cold beverage /ice/ popsicles between glasses of prep.  Place a piece of sugar-free hard candy in mouth between glasses.  If you become nauseated, try consuming smaller amounts, or stretch out the time between glasses. Stop for 30-60 minutes, then slowly start back drinking.        OTHER INSTRUCTIONS  You will need a responsible adult at least 62 years of age to accompany you and drive you home. This person must remain in the waiting room during your procedure. The hospital  will cancel your procedure if you do not have a responsible adult with you.   1. Wear loose fitting clothing that is easily removed. 2. Leave jewelry and other valuables at home.  3. Remove all body piercing jewelry and leave at home. 4. Total time from sign-in until discharge is approximately 2-3 hours. 5. You should go home directly after your procedure and rest. You can resume normal activities the day after your procedure. 6. The day of your procedure you should not:  Drive  Make legal decisions  Operate machinery  Drink alcohol  Return to work   You may call the office (Dept: [phone]) before 5:00pm, or page the doctor on call [phone]) after 5:00pm, for further instructions, if necessary.   Insurance Information YOU WILL NEED TO CHECK WITH YOUR INSURANCE COMPANY FOR THE BENEFITS OF COVERAGE YOU HAVE FOR THIS PROCEDURE.  UNFORTUNATELY, NOT ALL INSURANCE COMPANIES HAVE BENEFITS TO COVER ALL OR PART OF THESE TYPES OF PROCEDURES.  IT IS YOUR RESPONSIBILITY TO CHECK YOUR BENEFITS, HOWEVER, WE WILL BE GLAD TO ASSIST YOU WITH ANY CODES YOUR INSURANCE COMPANY MAY NEED.    PLEASE NOTE THAT MOST INSURANCE COMPANIES WILL NOT COVER A SCREENING COLONOSCOPY FOR PEOPLE UNDER THE AGE OF 50  IF YOU HAVE BCBS INSURANCE, YOU MAY HAVE BENEFITS FOR A SCREENING COLONOSCOPY BUT IF POLYPS ARE FOUND THE DIAGNOSIS WILL CHANGE AND THEN YOU MAY HAVE A DEDUCTIBLE THAT WILL NEED TO BE MET. SO PLEASE MAKE SURE YOU CHECK YOUR BENEFITS FOR A SCREENING COLONOSCOPY AS WELL AS A DIAGNOSTIC COLONOSCOPY.

## 2019-07-28 NOTE — Progress Notes (Signed)
Ok to schedule.   Medication adjustments: 1 day prior to procedure: He will half the dose of his insulin. This will be: Humulin N 33 units in the morning and 23 units in the evening. Humulin R, this is his sliding scale insulin. If he needs the sliding scale insulin, he should take 1/2 the dose (10 units twice daily before meals). Hold diabetes meds the morning of his procedure.   If he knows his CPAP settings, he can bring this information with him.

## 2019-07-28 NOTE — Progress Notes (Signed)
Gastroenterology Pre-Procedure Review  Request Date: 07/28/2019 Requesting Physician: Dr. Adam Phenix, Last TCS 12 or more years ago in [place], [place] couldn't remember the physician who did it, one polyp per pt  PATIENT REVIEW QUESTIONS: The patient responded to the following health history questions as indicated:    1. Diabetes Melitis: yes 2. Joint replacements in the past 12 months: no 3. Major health problems in the past 3 months: no 4. Has an artificial valve or MVP: no 5. Has a defibrillator: no 6. Has been advised in past to take antibiotics in advance of a procedure like teeth cleaning: no 7. Family history of colon cancer: no  8. Alcohol Use: no 9. Illicit drug Use: no 10. History of sleep apnea: yes, CPAP 11. History of coronary artery or other vascular stents placed within the last 12 months: no 12. History of any prior anesthesia complications: no 13. There is no height or weight on file to calculate BMI.ht: 5'11 wt: 253 lbs    MEDICATIONS & ALLERGIES:    Patient reports the following regarding taking any blood thinners:   Plavix? no Aspirin? yes Coumadin? no Brilinta? no Xarelto? no Eliquis? no Pradaxa? no Savaysa? no Effient? no  Patient confirms/reports the following medications:  Current Outpatient Medications  Medication Sig Dispense Refill  . aspirin 81 MG tablet Take 81 mg by mouth daily.      Marland Kitchen atorvastatin (LIPITOR) 80 MG tablet Take 1 tablet (80 mg total) by mouth daily. 90 tablet 3  . Cholecalciferol (VITAMIN D) 2000 UNITS CAPS Take 2 capsules by mouth daily.    . Continuous Blood Gluc Receiver (FREESTYLE LIBRE READER) DEVI 1 each by Does not apply route daily. 1 Device 0  . Continuous Blood Gluc Sensor (FREESTYLE LIBRE SENSOR SYSTEM) MISC APPLY 1 SENSOR SUBCUTANEOUSLY EVERY 10 DAYS 3 each 3  . glucose blood (ONETOUCH ULTRA) test strip Test twice daily Dx E11.9 200 strip 3  . insulin NPH Human (HUMULIN N) 100 UNIT/ML injection INJECT SUBCUTANEOUSLY 66  UNITS IN THE MORNING AND 46 UNITS IN THE EVENING AS DIRECTED 100 mL 3  . insulin regular (HUMULIN R) 100 units/mL injection INJECT SUBCUTANEOUSLY 20  UNITS TWO TIMES DAILY  BEFORE MEALS 40 mL 3  . Insulin Syringe-Needle U-100 (B-D INS SYR ULTRAFINE 1CC/30G) 30G X 1/2" 1 ML MISC USE AS DIRECTED 3 TIMES  DAILY Dx E11.9 300 each 3  . levothyroxine (SYNTHROID) 175 MCG tablet TAKE 1 TABLET BY MOUTH  DAILY , AND NONE ON SUNDAY 78 tablet 0  . niacin (NIASPAN) 500 MG CR tablet Take 3 tablets (1,500 mg total) by mouth at bedtime. 270 tablet 1  . nystatin-triamcinolone (MYCOLOG II) cream APPLY 1 APPLICATION 2 (TWO) TIMES DAILY TOPICALLY. 30 g 1  . omega-3 acid ethyl esters (LOVAZA) 1 g capsule Take 2 capsules (2 g total) by mouth 2 (two) times daily. 360 capsule 3  . OneTouch Delica Lancets 17P MISC 1 applicator by Does not apply route 2 (two) times a day. 200 each 4  . ramipril (ALTACE) 5 MG capsule TAKE 1 CAPSULE BY MOUTH  DAILY 90 capsule 3   No current facility-administered medications for this visit.     Patient confirms/reports the following allergies:  Allergies  Allergen Reactions  . Ezetimibe-Simvastatin   . Iodine Swelling  . Shrimp [Shellfish Allergy] Swelling    No orders of the defined types were placed in this encounter.   AUTHORIZATION INFORMATION Primary Insurance: [place],  [place] #:[id number],  Group #:  [id number] Pre-Cert / Berkley Harvey required: No, not required  SCHEDULE INFORMATION: Procedure has been scheduled as follows:  Date: 10/07/2019, Time: 12:00 Location: [facility] with Dr. Jena Gauss  This Gastroenterology Pre-Precedure Review Form is being routed to the following provider(s): Ermalinda Memos, PA

## 2019-07-29 ENCOUNTER — Encounter: Payer: Self-pay | Admitting: *Deleted

## 2019-07-29 NOTE — Progress Notes (Signed)
Called pt and informed him to bring CPAP setting information with him the day of his procedure.  Pt made aware that I would be mailing out his prep instructions including his diabetes medication adjustments.

## 2019-07-29 NOTE — Addendum Note (Signed)
Addended by: Metro Kung on: 07/29/2019 01:22 PM   Modules accepted: Orders, SmartSet

## 2019-09-09 ENCOUNTER — Other Ambulatory Visit: Payer: Self-pay | Admitting: *Deleted

## 2019-09-09 ENCOUNTER — Telehealth: Payer: Self-pay | Admitting: *Deleted

## 2019-09-09 MED ORDER — ONETOUCH DELICA LANCING DEV MISC: 0 refills | Status: AC

## 2019-09-09 MED ORDER — "BD INSULIN SYRINGE U/F 30G X 1/2"" 1 ML MISC": 3 refills | Status: AC

## 2019-09-09 NOTE — Telephone Encounter (Signed)
Prior Auth for Lovaza1 gm capsule-In Process  Form filled out and faxed

## 2019-09-21 ENCOUNTER — Other Ambulatory Visit: Payer: Self-pay | Admitting: Family Medicine

## 2019-09-21 ENCOUNTER — Telehealth: Payer: Self-pay | Admitting: *Deleted

## 2019-09-21 MED ORDER — ICOSAPENT ETHYL 1 G PO CAPS: 2.0000 g | ORAL_CAPSULE | Freq: Two times a day (BID) | ORAL | 5 refills | Status: AC

## 2019-09-21 NOTE — Telephone Encounter (Signed)
Called pt to inform him that his Covid screening and procedure will need to be cancelled due to Covid surge.  Pt is aware that we will contact him to reschedule once we get clearance.  Pt voiced understanding.  Endo notified.

## 2019-09-21 NOTE — Telephone Encounter (Signed)
PA was denied. This medication is not a covered benefit for this patient.

## 2019-09-21 NOTE — Telephone Encounter (Signed)
Please inform patient.  Can try vascepa again if he wants and if ins will cover.

## 2019-09-22 NOTE — Telephone Encounter (Signed)
Pt aware and ok to try Vascepa again to see if insurance will cover. It was already sent in.

## 2019-09-23 DIAGNOSIS — Z6836 Body mass index (BMI) 36.0-36.9, adult: Secondary | ICD-10-CM | POA: Diagnosis not present

## 2019-09-23 DIAGNOSIS — E1159 Type 2 diabetes mellitus with other circulatory complications: Secondary | ICD-10-CM | POA: Diagnosis not present

## 2019-09-23 DIAGNOSIS — I10 Essential (primary) hypertension: Secondary | ICD-10-CM | POA: Diagnosis not present

## 2019-09-23 DIAGNOSIS — K635 Polyp of colon: Secondary | ICD-10-CM | POA: Diagnosis not present

## 2019-09-23 DIAGNOSIS — Z0000 Encounter for general adult medical examination without abnormal findings: Secondary | ICD-10-CM | POA: Diagnosis not present

## 2019-09-23 DIAGNOSIS — E11649 Type 2 diabetes mellitus with hypoglycemia without coma: Secondary | ICD-10-CM | POA: Diagnosis not present

## 2019-09-23 DIAGNOSIS — E785 Hyperlipidemia, unspecified: Secondary | ICD-10-CM | POA: Diagnosis not present

## 2019-09-23 DIAGNOSIS — Z794 Long term (current) use of insulin: Secondary | ICD-10-CM | POA: Diagnosis not present

## 2019-09-23 DIAGNOSIS — E113293 Type 2 diabetes mellitus with mild nonproliferative diabetic retinopathy without macular edema, bilateral: Secondary | ICD-10-CM | POA: Diagnosis not present

## 2019-09-25 DIAGNOSIS — E11649 Type 2 diabetes mellitus with hypoglycemia without coma: Secondary | ICD-10-CM | POA: Diagnosis not present

## 2019-10-05 ENCOUNTER — Other Ambulatory Visit (HOSPITAL_COMMUNITY): Payer: BC Managed Care – PPO

## 2019-10-07 ENCOUNTER — Ambulatory Visit (HOSPITAL_COMMUNITY): Admit: 2019-10-07 | Payer: BC Managed Care – PPO | Admitting: Internal Medicine

## 2019-10-07 ENCOUNTER — Encounter (HOSPITAL_COMMUNITY): Payer: Self-pay

## 2019-10-07 SURGERY — COLONOSCOPY
Anesthesia: Moderate Sedation

## 2019-10-08 ENCOUNTER — Telehealth: Payer: Self-pay | Admitting: *Deleted

## 2019-10-08 NOTE — Telephone Encounter (Addendum)
Left message to call back    Per Dr Antoine Poche  Murmur heard by Raliegh Ip, DO.  I will move up his follow up from 2022 to 2021 March.  I did not appreciate this before and said to be soft murmur without symptoms  ----- Message from Rollene Rotunda, MD sent at 06/14/2019  7:15 PM EDT -----   ----- Message ----- From: Raliegh Ip, DO Sent: 06/09/2019   5:12 PM EDT To: Rollene Rotunda, MD  I heard a soft systolic murmur today on this mutual patient.  He is asymptomatic but I could not find this on previous exam notes so I thought I'd FYI you.  Thanks for the excellent care you provide our community  [place]. Nadine Counts, DO [facility]

## 2019-10-08 NOTE — Telephone Encounter (Signed)
Called pt to reschedule his procedure.  Pt said that he switched doctors and was having it done somewhere else.

## 2019-10-09 NOTE — Telephone Encounter (Signed)
Left message to call back  

## 2019-10-09 NOTE — Telephone Encounter (Signed)
Patient is returning phone call and states he wants to speak with his other Doctor before scheduling appointment and will callback in regards to it.

## 2019-10-09 NOTE — Telephone Encounter (Signed)
Spoke to patient he stated he changed PCP's to Systems analyst at [facility] in [place].Stated he just saw him and he is doing good.He is scheduled to see him again in 6 months.Advised I will send message to Dr.Hochrein's nurse.

## 2019-10-15 NOTE — Telephone Encounter (Signed)
Unable to reach patient. Per note patient does not want to schedule anything at this time, will await callback

## 2019-10-26 DIAGNOSIS — D123 Benign neoplasm of transverse colon: Secondary | ICD-10-CM | POA: Diagnosis not present

## 2019-10-26 DIAGNOSIS — Z9119 Patient's noncompliance with other medical treatment and regimen: Secondary | ICD-10-CM | POA: Diagnosis not present

## 2019-10-26 DIAGNOSIS — D125 Benign neoplasm of sigmoid colon: Secondary | ICD-10-CM | POA: Diagnosis not present

## 2019-10-26 DIAGNOSIS — Z538 Procedure and treatment not carried out for other reasons: Secondary | ICD-10-CM | POA: Diagnosis not present

## 2019-10-26 DIAGNOSIS — Z1211 Encounter for screening for malignant neoplasm of colon: Secondary | ICD-10-CM | POA: Diagnosis not present

## 2019-11-14 ENCOUNTER — Other Ambulatory Visit: Payer: Self-pay | Admitting: Family Medicine

## 2019-11-14 DIAGNOSIS — E1169 Type 2 diabetes mellitus with other specified complication: Secondary | ICD-10-CM

## 2019-11-14 DIAGNOSIS — E785 Hyperlipidemia, unspecified: Secondary | ICD-10-CM

## 2019-12-09 DIAGNOSIS — G4733 Obstructive sleep apnea (adult) (pediatric): Secondary | ICD-10-CM | POA: Diagnosis not present

## 2019-12-17 ENCOUNTER — Telehealth: Payer: Self-pay | Admitting: *Deleted

## 2019-12-17 NOTE — Telephone Encounter (Signed)
A message was left, re: his follow up visit. 

## 2019-12-23 ENCOUNTER — Encounter: Payer: Self-pay | Admitting: General Practice

## 2019-12-25 ENCOUNTER — Other Ambulatory Visit: Payer: Self-pay | Admitting: Family Medicine

## 2019-12-25 DIAGNOSIS — E1169 Type 2 diabetes mellitus with other specified complication: Secondary | ICD-10-CM

## 2020-01-14 DIAGNOSIS — G4733 Obstructive sleep apnea (adult) (pediatric): Secondary | ICD-10-CM | POA: Diagnosis not present

## 2020-03-08 ENCOUNTER — Other Ambulatory Visit: Payer: Self-pay | Admitting: Family Medicine

## 2020-03-08 DIAGNOSIS — E113293 Type 2 diabetes mellitus with mild nonproliferative diabetic retinopathy without macular edema, bilateral: Secondary | ICD-10-CM

## 2020-03-24 DIAGNOSIS — E11649 Type 2 diabetes mellitus with hypoglycemia without coma: Secondary | ICD-10-CM | POA: Diagnosis not present

## 2020-03-24 DIAGNOSIS — E039 Hypothyroidism, unspecified: Secondary | ICD-10-CM | POA: Diagnosis not present

## 2020-03-24 DIAGNOSIS — E1169 Type 2 diabetes mellitus with other specified complication: Secondary | ICD-10-CM | POA: Diagnosis not present

## 2020-03-24 DIAGNOSIS — E1159 Type 2 diabetes mellitus with other circulatory complications: Secondary | ICD-10-CM | POA: Diagnosis not present

## 2020-03-24 DIAGNOSIS — I152 Hypertension secondary to endocrine disorders: Secondary | ICD-10-CM | POA: Diagnosis not present

## 2020-04-21 DIAGNOSIS — I152 Hypertension secondary to endocrine disorders: Secondary | ICD-10-CM | POA: Diagnosis not present

## 2020-04-21 DIAGNOSIS — M25561 Pain in right knee: Secondary | ICD-10-CM | POA: Diagnosis not present

## 2020-04-21 DIAGNOSIS — E1159 Type 2 diabetes mellitus with other circulatory complications: Secondary | ICD-10-CM | POA: Diagnosis not present

## 2020-04-21 DIAGNOSIS — G8929 Other chronic pain: Secondary | ICD-10-CM | POA: Diagnosis not present

## 2020-06-23 DIAGNOSIS — E039 Hypothyroidism, unspecified: Secondary | ICD-10-CM | POA: Diagnosis not present

## 2020-06-23 DIAGNOSIS — Z23 Encounter for immunization: Secondary | ICD-10-CM | POA: Diagnosis not present

## 2020-06-23 DIAGNOSIS — M25561 Pain in right knee: Secondary | ICD-10-CM | POA: Diagnosis not present

## 2020-06-23 DIAGNOSIS — E11649 Type 2 diabetes mellitus with hypoglycemia without coma: Secondary | ICD-10-CM | POA: Diagnosis not present

## 2020-06-23 DIAGNOSIS — I152 Hypertension secondary to endocrine disorders: Secondary | ICD-10-CM | POA: Diagnosis not present

## 2020-06-23 DIAGNOSIS — E1159 Type 2 diabetes mellitus with other circulatory complications: Secondary | ICD-10-CM | POA: Diagnosis not present

## 2020-06-25 DIAGNOSIS — K8309 Other cholangitis: Secondary | ICD-10-CM | POA: Diagnosis not present

## 2020-06-25 DIAGNOSIS — R739 Hyperglycemia, unspecified: Secondary | ICD-10-CM | POA: Diagnosis not present

## 2020-06-25 DIAGNOSIS — E1110 Type 2 diabetes mellitus with ketoacidosis without coma: Secondary | ICD-10-CM | POA: Diagnosis not present

## 2020-06-25 DIAGNOSIS — E785 Hyperlipidemia, unspecified: Secondary | ICD-10-CM | POA: Diagnosis not present

## 2020-06-25 DIAGNOSIS — Z6836 Body mass index (BMI) 36.0-36.9, adult: Secondary | ICD-10-CM | POA: Diagnosis not present

## 2020-06-25 DIAGNOSIS — K6389 Other specified diseases of intestine: Secondary | ICD-10-CM | POA: Diagnosis not present

## 2020-06-25 DIAGNOSIS — I5189 Other ill-defined heart diseases: Secondary | ICD-10-CM | POA: Diagnosis not present

## 2020-06-25 DIAGNOSIS — Z87891 Personal history of nicotine dependence: Secondary | ICD-10-CM | POA: Diagnosis not present

## 2020-06-25 DIAGNOSIS — I2510 Atherosclerotic heart disease of native coronary artery without angina pectoris: Secondary | ICD-10-CM | POA: Diagnosis not present

## 2020-06-25 DIAGNOSIS — Z20822 Contact with and (suspected) exposure to covid-19: Secondary | ICD-10-CM | POA: Diagnosis not present

## 2020-06-25 DIAGNOSIS — R0789 Other chest pain: Secondary | ICD-10-CM | POA: Diagnosis not present

## 2020-06-25 DIAGNOSIS — I071 Rheumatic tricuspid insufficiency: Secondary | ICD-10-CM | POA: Diagnosis not present

## 2020-06-25 DIAGNOSIS — R0600 Dyspnea, unspecified: Secondary | ICD-10-CM | POA: Diagnosis not present

## 2020-06-25 DIAGNOSIS — E039 Hypothyroidism, unspecified: Secondary | ICD-10-CM | POA: Diagnosis not present

## 2020-06-25 DIAGNOSIS — R1013 Epigastric pain: Secondary | ICD-10-CM | POA: Diagnosis not present

## 2020-06-25 DIAGNOSIS — I517 Cardiomegaly: Secondary | ICD-10-CM | POA: Diagnosis not present

## 2020-06-25 DIAGNOSIS — R110 Nausea: Secondary | ICD-10-CM | POA: Diagnosis not present

## 2020-06-25 DIAGNOSIS — R112 Nausea with vomiting, unspecified: Secondary | ICD-10-CM | POA: Diagnosis not present

## 2020-06-25 DIAGNOSIS — K8050 Calculus of bile duct without cholangitis or cholecystitis without obstruction: Secondary | ICD-10-CM | POA: Diagnosis not present

## 2020-06-25 DIAGNOSIS — R079 Chest pain, unspecified: Secondary | ICD-10-CM | POA: Diagnosis not present

## 2020-06-25 DIAGNOSIS — Z7984 Long term (current) use of oral hypoglycemic drugs: Secondary | ICD-10-CM | POA: Diagnosis not present

## 2020-06-25 DIAGNOSIS — K8070 Calculus of gallbladder and bile duct without cholecystitis without obstruction: Secondary | ICD-10-CM | POA: Diagnosis not present

## 2020-06-25 DIAGNOSIS — I4510 Unspecified right bundle-branch block: Secondary | ICD-10-CM | POA: Diagnosis not present

## 2020-06-25 DIAGNOSIS — K219 Gastro-esophageal reflux disease without esophagitis: Secondary | ICD-10-CM | POA: Diagnosis not present

## 2020-06-25 DIAGNOSIS — K760 Fatty (change of) liver, not elsewhere classified: Secondary | ICD-10-CM | POA: Diagnosis not present

## 2020-06-25 DIAGNOSIS — R7401 Elevation of levels of liver transaminase levels: Secondary | ICD-10-CM | POA: Diagnosis not present

## 2020-06-25 DIAGNOSIS — Z79899 Other long term (current) drug therapy: Secondary | ICD-10-CM | POA: Diagnosis not present

## 2020-06-25 DIAGNOSIS — K8010 Calculus of gallbladder with chronic cholecystitis without obstruction: Secondary | ICD-10-CM | POA: Diagnosis not present

## 2020-06-25 DIAGNOSIS — R231 Pallor: Secondary | ICD-10-CM | POA: Diagnosis not present

## 2020-06-25 DIAGNOSIS — K8020 Calculus of gallbladder without cholecystitis without obstruction: Secondary | ICD-10-CM | POA: Diagnosis not present

## 2020-06-25 DIAGNOSIS — R7989 Other specified abnormal findings of blood chemistry: Secondary | ICD-10-CM | POA: Diagnosis not present

## 2020-06-25 DIAGNOSIS — J984 Other disorders of lung: Secondary | ICD-10-CM | POA: Diagnosis not present

## 2020-06-25 DIAGNOSIS — Z6834 Body mass index (BMI) 34.0-34.9, adult: Secondary | ICD-10-CM | POA: Diagnosis not present

## 2020-06-25 DIAGNOSIS — K838 Other specified diseases of biliary tract: Secondary | ICD-10-CM | POA: Diagnosis not present

## 2020-06-25 DIAGNOSIS — E669 Obesity, unspecified: Secondary | ICD-10-CM | POA: Diagnosis not present

## 2020-06-25 DIAGNOSIS — E875 Hyperkalemia: Secondary | ICD-10-CM | POA: Diagnosis not present

## 2020-07-11 DIAGNOSIS — E1110 Type 2 diabetes mellitus with ketoacidosis without coma: Secondary | ICD-10-CM | POA: Diagnosis not present

## 2020-07-11 DIAGNOSIS — Z9049 Acquired absence of other specified parts of digestive tract: Secondary | ICD-10-CM | POA: Diagnosis not present

## 2020-07-11 DIAGNOSIS — E11649 Type 2 diabetes mellitus with hypoglycemia without coma: Secondary | ICD-10-CM | POA: Diagnosis not present

## 2020-08-11 DIAGNOSIS — E785 Hyperlipidemia, unspecified: Secondary | ICD-10-CM | POA: Diagnosis not present

## 2020-08-11 DIAGNOSIS — I152 Hypertension secondary to endocrine disorders: Secondary | ICD-10-CM | POA: Diagnosis not present

## 2020-08-11 DIAGNOSIS — E1169 Type 2 diabetes mellitus with other specified complication: Secondary | ICD-10-CM | POA: Diagnosis not present

## 2020-08-11 DIAGNOSIS — I2510 Atherosclerotic heart disease of native coronary artery without angina pectoris: Secondary | ICD-10-CM | POA: Diagnosis not present

## 2020-08-11 DIAGNOSIS — E1159 Type 2 diabetes mellitus with other circulatory complications: Secondary | ICD-10-CM | POA: Diagnosis not present
